# Patient Record
Sex: Female | Born: 1935 | Race: White | Hispanic: No | Smoking: Never smoker
Health system: Southern US, Community
[De-identification: ages and names within clinical notes are randomized; demographics above are authoritative.]

## PROBLEM LIST (undated history)

## (undated) DIAGNOSIS — E119 Type 2 diabetes mellitus without complications: Secondary | ICD-10-CM

## (undated) DIAGNOSIS — C4490 Unspecified malignant neoplasm of skin, unspecified: Secondary | ICD-10-CM

## (undated) DIAGNOSIS — K219 Gastro-esophageal reflux disease without esophagitis: Secondary | ICD-10-CM

## (undated) DIAGNOSIS — M810 Age-related osteoporosis without current pathological fracture: Secondary | ICD-10-CM

## (undated) DIAGNOSIS — E039 Hypothyroidism, unspecified: Secondary | ICD-10-CM

## (undated) DIAGNOSIS — C259 Malignant neoplasm of pancreas, unspecified: Secondary | ICD-10-CM

## (undated) HISTORY — PX: CHOLECYSTECTOMY: SHX55

## (undated) HISTORY — DX: Age-related osteoporosis without current pathological fracture: M81.0

## (undated) HISTORY — DX: Hypothyroidism, unspecified: E03.9

## (undated) HISTORY — DX: Type 2 diabetes mellitus without complications: E11.9

## (undated) HISTORY — DX: Gastro-esophageal reflux disease without esophagitis: K21.9

## (undated) HISTORY — DX: Malignant neoplasm of pancreas, unspecified: C25.9

## (undated) HISTORY — DX: Unspecified malignant neoplasm of skin, unspecified: C44.90

---

## 2015-01-29 ENCOUNTER — Ambulatory Visit
Admit: 2015-01-29 | Disposition: A | Discharge status: Home / Self Care | Payer: Self-pay | Attending: Oncology | Admitting: Oncology

## 2015-02-03 ENCOUNTER — Inpatient Hospital Stay: Payer: Self-pay | Admitting: Internal Medicine

## 2015-02-13 ENCOUNTER — Ambulatory Visit
Admit: 2015-02-13 | Disposition: A | Discharge status: Home / Self Care | Payer: Self-pay | Attending: Oncology | Admitting: Oncology

## 2015-02-14 ENCOUNTER — Ambulatory Visit: Payer: Self-pay | Admitting: General Surgery

## 2015-02-22 LAB — CBC CANCER CENTER
BASOS ABS: 0.1 x10 3/mm (ref 0.0–0.1)
Basophil %: 1.4 %
EOS ABS: 0.2 x10 3/mm (ref 0.0–0.7)
EOS PCT: 2.2 %
HCT: 35.6 % (ref 35.0–47.0)
HGB: 12.1 g/dL (ref 12.0–16.0)
LYMPHS ABS: 1.4 x10 3/mm (ref 1.0–3.6)
Lymphocyte %: 20 %
MCH: 32.2 pg (ref 26.0–34.0)
MCHC: 34.1 g/dL (ref 32.0–36.0)
MCV: 95 fL (ref 80–100)
MONO ABS: 0.7 x10 3/mm (ref 0.2–0.9)
MONOS PCT: 9.8 %
NEUTROS PCT: 66.6 %
Neutrophil #: 4.7 x10 3/mm (ref 1.4–6.5)
PLATELETS: 313 x10 3/mm (ref 150–440)
RBC: 3.76 10*6/uL — AB (ref 3.80–5.20)
RDW: 15.4 % — ABNORMAL HIGH (ref 11.5–14.5)
WBC: 7.1 x10 3/mm (ref 3.6–11.0)

## 2015-02-22 LAB — COMPREHENSIVE METABOLIC PANEL
ALT: 34 U/L
Albumin: 3.4 g/dL — ABNORMAL LOW
Alkaline Phosphatase: 137 U/L — ABNORMAL HIGH
Anion Gap: 5 — ABNORMAL LOW (ref 7–16)
BUN: 19 mg/dL
Bilirubin,Total: 2.7 mg/dL — ABNORMAL HIGH
CALCIUM: 8.7 mg/dL — AB
Chloride: 93 mmol/L — ABNORMAL LOW
Co2: 29 mmol/L
Creatinine: 0.83 mg/dL
EGFR (Non-African Amer.): >60
Glucose: 229 mg/dL — ABNORMAL HIGH
Potassium: 4.2 mmol/L
SGOT(AST): 33 U/L
Sodium: 127 mmol/L — ABNORMAL LOW
Total Protein: 6.9 g/dL

## 2015-02-26 LAB — CANCER ANTIGEN 19-9: CA 19 9: 10820 U/mL — AB (ref 0–35)

## 2015-02-27 LAB — CBC CANCER CENTER
Basophil #: 0 x10 3/mm (ref 0.0–0.1)
Basophil %: 0.8 %
EOS PCT: 3.9 %
Eosinophil #: 0.1 x10 3/mm (ref 0.0–0.7)
HCT: 33.8 % — ABNORMAL LOW (ref 35.0–47.0)
HGB: 11.4 g/dL — ABNORMAL LOW (ref 12.0–16.0)
LYMPHS ABS: 0.9 x10 3/mm — AB (ref 1.0–3.6)
Lymphocyte %: 27.1 %
MCH: 32.2 pg (ref 26.0–34.0)
MCHC: 33.8 g/dL (ref 32.0–36.0)
MCV: 95 fL (ref 80–100)
MONOS PCT: 7 %
Monocyte #: 0.2 x10 3/mm (ref 0.2–0.9)
NEUTROS PCT: 61.2 %
Neutrophil #: 2 x10 3/mm (ref 1.4–6.5)
PLATELETS: 193 x10 3/mm (ref 150–440)
RBC: 3.54 10*6/uL — ABNORMAL LOW (ref 3.80–5.20)
RDW: 14.8 % — AB (ref 11.5–14.5)
WBC: 3.2 x10 3/mm — ABNORMAL LOW (ref 3.6–11.0)

## 2015-02-27 LAB — COMPREHENSIVE METABOLIC PANEL
ALT: 26 U/L
AST: 28 U/L
Albumin: 3.4 g/dL — ABNORMAL LOW
Alkaline Phosphatase: 94 U/L
Anion Gap: 5 — ABNORMAL LOW (ref 7–16)
BUN: 20 mg/dL
Bilirubin,Total: 1.9 mg/dL — ABNORMAL HIGH
CO2: 28 mmol/L
Calcium, Total: 8.6 mg/dL — ABNORMAL LOW
Chloride: 97 mmol/L — ABNORMAL LOW
Creatinine: 0.71 mg/dL
EGFR (African American): >60
Glucose: 174 mg/dL — ABNORMAL HIGH
POTASSIUM: 4.1 mmol/L
SODIUM: 130 mmol/L — AB
Total Protein: 6.5 g/dL

## 2015-02-27 LAB — CREATININE, SERUM: Creatine, Serum: 0.71

## 2015-02-28 ENCOUNTER — Ambulatory Visit
Admit: 2015-02-28 | Disposition: A | Discharge status: Home / Self Care | Payer: Self-pay | Attending: Vascular Surgery | Admitting: Vascular Surgery

## 2015-03-01 ENCOUNTER — Ambulatory Visit
Admit: 2015-03-01 | Disposition: A | Discharge status: Home / Self Care | Payer: Self-pay | Attending: Oncology | Admitting: Oncology

## 2015-03-06 LAB — COMPREHENSIVE METABOLIC PANEL
ANION GAP: 3 — AB (ref 7–16)
Albumin: 3.1 g/dL — ABNORMAL LOW
Alkaline Phosphatase: 72 U/L
BILIRUBIN TOTAL: 1.2 mg/dL
BUN: 15 mg/dL
CO2: 27 mmol/L
CREATININE: 0.54 mg/dL
Calcium, Total: 8.2 mg/dL — ABNORMAL LOW
Chloride: 97 mmol/L — ABNORMAL LOW
EGFR (African American): >60
Glucose: 180 mg/dL — ABNORMAL HIGH
POTASSIUM: 4 mmol/L
SGOT(AST): 22 U/L
SGPT (ALT): 19 U/L
SODIUM: 127 mmol/L — AB
Total Protein: 6.1 g/dL — ABNORMAL LOW

## 2015-03-06 LAB — CBC CANCER CENTER
Basophil #: 0 x10 3/mm (ref 0.0–0.1)
Basophil %: 0.3 %
EOS PCT: 1.8 %
Eosinophil #: 0 x10 3/mm (ref 0.0–0.7)
HCT: 31.6 % — AB (ref 35.0–47.0)
HGB: 10.8 g/dL — AB (ref 12.0–16.0)
Lymphocyte #: 1 x10 3/mm (ref 1.0–3.6)
Lymphocyte %: 38.6 %
MCH: 32.5 pg (ref 26.0–34.0)
MCHC: 34.1 g/dL (ref 32.0–36.0)
MCV: 95 fL (ref 80–100)
MONO ABS: 0.2 x10 3/mm (ref 0.2–0.9)
Monocyte %: 7.6 %
NEUTROS ABS: 1.3 x10 3/mm — AB (ref 1.4–6.5)
NEUTROS PCT: 51.7 %
Platelet: 163 x10 3/mm (ref 150–440)
RBC: 3.32 10*6/uL — AB (ref 3.80–5.20)
RDW: 14.5 % (ref 11.5–14.5)
WBC: 2.5 x10 3/mm — ABNORMAL LOW (ref 3.6–11.0)

## 2015-03-20 LAB — CBC CANCER CENTER
BASOS ABS: 0.1 x10 3/mm (ref 0.0–0.1)
Basophil %: 1.1 %
EOS ABS: 0.2 x10 3/mm (ref 0.0–0.7)
Eosinophil %: 2.2 %
HCT: 32.8 % — AB (ref 35.0–47.0)
HGB: 10.9 g/dL — ABNORMAL LOW (ref 12.0–16.0)
LYMPHS ABS: 1.4 x10 3/mm (ref 1.0–3.6)
Lymphocyte %: 17.6 %
MCH: 32.2 pg (ref 26.0–34.0)
MCHC: 33.3 g/dL (ref 32.0–36.0)
MCV: 97 fL (ref 80–100)
MONO ABS: 1.2 x10 3/mm — AB (ref 0.2–0.9)
Monocyte %: 15.6 %
NEUTROS ABS: 4.9 x10 3/mm (ref 1.4–6.5)
Neutrophil %: 63.5 %
PLATELETS: 359 x10 3/mm (ref 150–440)
RBC: 3.4 10*6/uL — ABNORMAL LOW (ref 3.80–5.20)
RDW: 15.4 % — AB (ref 11.5–14.5)
WBC: 7.8 x10 3/mm (ref 3.6–11.0)

## 2015-03-20 LAB — COMPREHENSIVE METABOLIC PANEL
ALBUMIN: 2.8 g/dL — AB
AST: 20 U/L
Alkaline Phosphatase: 61 U/L
Anion Gap: 7 (ref 7–16)
BUN: 16 mg/dL
Bilirubin,Total: 0.9 mg/dL
CALCIUM: 8.6 mg/dL — AB
Chloride: 99 mmol/L — ABNORMAL LOW
Co2: 30 mmol/L
Creatinine: 0.52 mg/dL
EGFR (African American): >60
EGFR (Non-African Amer.): >60
GLUCOSE: 186 mg/dL — AB
Potassium: 3.5 mmol/L
SGPT (ALT): 15 U/L
Sodium: 136 mmol/L
TOTAL PROTEIN: 5.8 g/dL — AB

## 2015-03-21 LAB — CANCER ANTIGEN 19-9: CA 19-9: 3673 U/mL — ABNORMAL HIGH (ref 0–35)

## 2015-03-27 LAB — CBC CANCER CENTER
BASOS ABS: 0.1 x10 3/mm (ref 0.0–0.1)
Basophil %: 2.9 %
EOS ABS: 0 x10 3/mm (ref 0.0–0.7)
Eosinophil %: 1 %
HCT: 30 % — ABNORMAL LOW (ref 35.0–47.0)
HGB: 10.1 g/dL — AB (ref 12.0–16.0)
LYMPHS ABS: 1.1 x10 3/mm (ref 1.0–3.6)
LYMPHS PCT: 24.3 %
MCH: 32.5 pg (ref 26.0–34.0)
MCHC: 33.5 g/dL (ref 32.0–36.0)
MCV: 97 fL (ref 80–100)
MONOS PCT: 7.8 %
Monocyte #: 0.3 x10 3/mm (ref 0.2–0.9)
Neutrophil #: 2.8 x10 3/mm (ref 1.4–6.5)
Neutrophil %: 64 %
Platelet: 300 x10 3/mm (ref 150–440)
RBC: 3.09 10*6/uL — AB (ref 3.80–5.20)
RDW: 14.6 % — ABNORMAL HIGH (ref 11.5–14.5)
WBC: 4.4 x10 3/mm (ref 3.6–11.0)

## 2015-03-27 LAB — COMPREHENSIVE METABOLIC PANEL
ALT: 17 U/L
Albumin: 3 g/dL — ABNORMAL LOW
Alkaline Phosphatase: 60 U/L
Anion Gap: 3 — ABNORMAL LOW (ref 7–16)
BILIRUBIN TOTAL: 0.7 mg/dL
BUN: 19 mg/dL
CREATININE: 0.67 mg/dL
Calcium, Total: 7.8 mg/dL — ABNORMAL LOW
Chloride: 97 mmol/L — ABNORMAL LOW
Co2: 28 mmol/L
EGFR (African American): >60
EGFR (Non-African Amer.): >60
Glucose: 222 mg/dL — ABNORMAL HIGH
POTASSIUM: 3.7 mmol/L
SGOT(AST): 25 U/L
Sodium: 128 mmol/L — ABNORMAL LOW
TOTAL PROTEIN: 5.9 g/dL — AB

## 2015-03-29 ENCOUNTER — Other Ambulatory Visit: Payer: Self-pay | Admitting: Oncology

## 2015-03-29 DIAGNOSIS — C787 Secondary malignant neoplasm of liver and intrahepatic bile duct: Principal | ICD-10-CM

## 2015-03-29 DIAGNOSIS — C259 Malignant neoplasm of pancreas, unspecified: Secondary | ICD-10-CM | POA: Insufficient documentation

## 2015-03-31 NOTE — Discharge Summary (Signed)
PATIENT NAME:  Amy Mckenzie, Amy Mckenzie MR#:  [id number] DATE OF BIRTH:  10-Jul-1921  DATE OF ADMISSION:  02/03/2015 DATE OF DISCHARGE:  02/06/2015  ADMITTING DIAGNOSIS: Jaundice.   DISCHARGE DIAGNOSES:  1.  Obstructive jaundice, painless.  2.  Pancreatic head mass.   3.  Right hepatic lobe mass of 14 mm status post endoscopic retrograde cholangiopancreatography on 02/05/2015 by Dr. Allen Norris and stent to common bile duct placement.  4.  Diabetes mellitus.   5.  Hypokalemia.  6.  Hypothyroidism, not otherwise specified.  7.  Back pain.  8.  PVC's,  Cardiac arrhythmia, not otherwise specified.  10.  Gastroesophageal reflux disease without esophagitis.  11.  Osteoporosis.   12.  Lower extremity edema.   DISCHARGE CONDITION: Stable.   DISCHARGE MEDICATIONS: The patient is to continue propranolol 40 mg p.o. twice daily, omeprazole 10 mg p.o. daily, meloxicam 60 mg p.o. daily, levothyroxine 25 mcg alternating with 50 mcg every other day, docusate 100 mg twice daily as needed.   HOME OXYGEN: None.   DIET: Two grams salt, low-fat, low-cholesterol, carbohydrate -controlled diet, Glucerna dietary supplements 3 times a day. Diet consistency: Regular.   ACTIVITY LIMITATIONS: As tolerated.   FOLLOWUP APPOINTMENT: Dr. Grayland Ormond in 1 week after discharge.   CONSULTANTS: Care management, social work, Dr. Grayland Ormond, Dr. Allen Norris, Dr. Vira Agar.   RADIOLOGIC STUDIES: PET CT scan, 02/06/2015, showed no focal hypermetabolic activity to suggest skeletal metastasis. Impression: Hypermetabolic FDG accumulation in the pancreatic head mass consistent with malignancy, 2 focal areas of FBG uptake in the liver suggest metastatic involvement.   HOSPITAL COURSE: The patient is a 81 year old Caucasian female with past medical history significant for history of multiple medical problems including diabetes, hypothyroidism, who presented to the hospital with complaints of jaundice. Please refer to Dr. Keenan Bachelor admission note on  02/03/2015. On arrival to the hospital, the patient's vitals, temperature was 97.9, pulse was 66, respiration rate was 20, blood pressure 146/71, saturation 100% on room air. Physical exam revealed a jaundiced lady. No significant abnormalities were otherwise noted. The patient did have 1+ lower extremity edema just above the ankles. The patient's lab data done on arrival to the Emergency Room showed potassium level low at 3.3, otherwise BMP was normal. The patient's liver enzymes revealed a total bilirubin of 11.7, alkaline phosphatase 426, AST as well as ALT were 217 and 312 respectively. CBC: White blood cell count was 5.6, hemoglobin was 12.0, platelet count was 221, 000, absolute neutrophil count was normal at 3.7. Coagulation panel was within normal limits. The patient was admitted to the hospital for further evaluation. Consultation with gastroenterologist was obtained. Dr. Vira Agar saw the patient in consultation the same day of admission and followed her along. He felt that the patient did have jaundice and recommended CT scan with contrast. The patient underwent a CT scan with contrast on 02/03/2015, which revealed the pancreatic head mass highly suspicious for malignancy associated with severe biliary dilatation, as well as pancreatic ductal dilatation. Hepatic as well as left renal cysts were noted. A right hepatic mass measuring 14 mm, possibly metastatic lesion was also noted and acute appearing compression deformity of L1 vertebral body with retropulsion causing canal narrowing. The patient was seen by Dr. Lucilla Lame and underwent ERCP on 02/05/2015. During ERCP she had severe biliary stricture noted as well as upper third of main bile duct and middle third of main bile duct were dilated. Sphincterotomy was performed and one metal stent was placed into common bile duct. Post procedure, the  patient's liver enzymes and total bilirubin improved. On 02/06/2015, the patient's total bilirubin was 9.0,  alkaline phosphatase 361, AST as well as ALT were 97 and 152 respectively. Lipase level was found to be slightly elevated at 195 on 02/06/2015; however, the patient had no symptoms, no pain on palpation. It was felt that the patient is stable to be discharged home today on 02/06/2015. She is to follow up with Dr. Grayland Ormond for further recommendations. Dr. Grayland Ormond who saw the patient in consultation on 02/05/2015, recommended PET CT scan for pancreatic cancer staging. He recommended to wait for ERCP cytology brushings and to get it CA-19-9 as well as PET scan for further evaluation, follow up with [facility] on March 14 or 15 to discuss biopsy results as well as treatment planning. The patient underwent PET CT scanning today, on 02/06/2015, and was ready to be discharged home. On the day of discharge, temperature is 97.5, pulse was 59, respiration rate was 20, blood pressure 148/80, saturation was 97%-98% on room air at rest.   TIME SPENT: 40 minutes.   ____________________________ Theodoro Grist, MD rv:bm D: 02/06/2015 17:14:35 ET T: 02/07/2015 00:48:50 ET JOB#: [id number]  cc: Theodoro Grist, MD, <Dictator> Kathlene November. Grayland Ormond, MD  Theodoro Grist MD ELECTRONICALLY SIGNED 02/13/2015 14:18

## 2015-03-31 NOTE — Consult Note (Signed)
PATIENT NAME:  Amy Mckenzie, Amy Mckenzie MR#:  [id number] DATE OF BIRTH:  02/16/1921  DATE OF CONSULTATION:  02/04/2015  REFERRING PHYSICIAN:   CONSULTING PHYSICIAN:  Manya Silvas, MD  HISTORY OF PRESENT ILLNESS: The patient is a 81 year old white female who was noted by her daughter, who is an Therapist, sports, over 2-week period of time she has developed deep jaundice. The patient lives in [place], about 3 hours away, and her daughter drove her back to [place] where she could get medical attention for her jaundice and any underlying cause.   PAST MEDICAL HISTORY: She has back pain. She had a right cheek skin cancer biopsy with a cosmetic surgery, history of hypothyroidism, history of diabetes type 2 without complications, history of GERD, osteoporosis.   PAST SURGICAL HISTORY: Gallbladder surgery, squamous cell carcinoma of the cheek, coccyx removal in 1949, and breast biopsies   ALLERGIES: SHE IS ALLERGIC TO MEDICATION THAT DILATES HER EYES.   FAMILY HISTORY: Father had cardiovascular disease and died at age 86. Mother died in an automobile accident at age 22.   SOCIAL HISTORY: The patient's husband passed away on 2022/12/30 at the age of 65.  REVIEW OF SYSTEMS: Complains of gradual weight loss over the last few years, noted that her urine is getting lighter. Walks with a cane. Denies any asthma or wheezing or chronic cough. Denies any skipping irregular heartbeats that she has been told brief spells of skipping heartbeats may be brief atrial fibrillation, possibly SVT as well. No rectal bleeding. No constipation or diarrhea. Usually has 1 bowel movement a day.   PHYSICAL EXAMINATION: GENERAL: An elderly white female who looks younger than stated age, in no distress.  VITAL SIGNS: Blood pressure 146/70, respirations 20, pulse 66, temperature 97.9. HEENT: Sclerae are icteric. No conjunctivitis. The head is atraumatic.  NECK: Negative.  CHEST: Clear.  HEART: Shows no murmurs or gallops I could hear.   ABDOMEN: Minimal discomfort right upper quadrant on palpation, questionable hepatomegaly.  EXTREMITIES: Some swelling in the lower extremities, but no pedal edema.   DIAGNOSTIC DATA: CAT scan of the abdomen shows a mass in the head of the pancreas and about a 15 mm rounded mass in the liver. The glucose is 98, BUN 20, creatinine 0.58, sodium 136, potassium 3.3, chloride 99, CO2 of 30, calcium 8.3, total protein 6.6, albumin 2.8, total bilirubin 11.7, direct bilirubin 9.2, alkaline phosphatase 426, SGOT 212, SGPT 312. White count 5.6, hemoglobin 12, hematocrit 34.7, platelet count 221,000.   ASSESSMENT: Recent development of jaundice with pancreatic mass, likely pancreatic cancer with common bile duct obstruction.   PLAN: To do an ERCP and probable stent placement tomorrow with Dr. Allen Norris.   ____________________________ Manya Silvas, MD rte:sw D: 02/04/2015 18:16:37 ET T: 02/04/2015 18:52:03 ET JOB#: [id number]  cc: Manya Silvas, MD, <Dictator> Manya Silvas MD ELECTRONICALLY SIGNED 02/07/2015 8:56

## 2015-03-31 NOTE — Consult Note (Signed)
Note Type Consult   Subjective: Chief Complaint/Diagnosis:   painless jaundice, pancreatic head mass suspicious for malignancy. HPI:   Patient is a 81 year old female who was in her usual state of health with her daughter noticed her becoming increasingly jaundiced. She presented to the emergency room is found to have a bilirubin greater than 11 as well as a pancreatic head mass highly suspicious for malignancy. Currently, patient feels well. She has no neurologic complaints. She denies any recent fevers or illnesses. She denies any weight loss. She has no chest pain or shortness of breath. She denies any abdominal pain. She denies any nausea, vomiting, constipation, or diarrhea. She has no urinary complaints. Patient otherwise feels well and offers no further specific complaints.   Review of Systems:  Performance Status (ECOG): 0  Review of Systems:   As per HPI. Otherwise, 10 point system review was negative.   Allergies:  NKDA: None  PFSH: Additional Past Medical and Surgical History: hypothyroidism, skin cancer, diabetes, GERD, osteoporosis. Cholecystectomy.    Family history: Diabetes, AAA.    Social history: Patient denies tobacco, occasional alcohol.   Home Medications: Medication Instructions Last Modified Date/Time  levothyroxine 25 mcg (0.025 mg) oral tablet 1 tab(s) orally every  other alternating with 50 mcg every other day. 07-Mar-16 11:07  raloxifene 60 mg oral tablet 1 tab(s) orally once a day 07-Mar-16 11:07  omeprazole 20 mg oral delayed release tablet 1 tab(s) orally once a day 07-Mar-16 11:07  propranolol 40 mg oral tablet 1 tab(s) orally 2 times a day 07-Mar-16 11:07   Vital Signs:  :: vital signs stable, patient afebrile.   Physical Exam:  General: well developed, well nourished, and in no acute distress  Mental Status: normal affect  Eyes: icteric sclera  Head, Ears, Nose,Throat: Normocephalic, moist mucous membranes, clear oropharynx without erythema or  thrush.  Neck, Thyroid: No palpable lymphadenopathy, thyroid midline without nodules.  Respiratory: clear to auscultation bilaterally  Cardiovascular: regular rate and rhythm, no murmur, rub, or gallop  Gastrointestinal: soft, nondistended, nontender, no organomegaly.  normal active bowel sounds  Musculoskeletal: No edema  Skin: jaundice.  Neurological: alert, answering all questions appropriately.  Cranial nerves grossly intact   Laboratory Results: Hepatic:  06-Mar-16 19:18   Bilirubin, Total  11.5  Bilirubin, Total  11.7  Bilirubin, Direct  9.2 (Result(s) reported on 03 Feb 2015 at 08:05PM.)  Alkaline Phosphatase  422  Alkaline Phosphatase  426  SGPT (ALT)  309  SGPT (ALT)  312  SGOT (AST)  216  SGOT (AST)  217  Total Protein, Serum 6.5  Total Protein, Serum 6.6  Albumin, Serum  2.8  Albumin, Serum  2.8  Routine Chem:  06-Mar-16 19:18   Glucose, Serum 98  BUN  20  Creatinine (comp)  0.58  Sodium, Serum 136  Potassium, Serum  3.3  Chloride, Serum 99  CO2, Serum 30  Calcium (Total), Serum  8.3  Osmolality (calc) 275  eGFR (African American) >60  eGFR (Non-African American) >60 (eGFR values <68m/min/1.73 m2 may be an indication of chronic kidney disease (CKD). Calculated eGFR, using the MRDR Study equation, is useful in  patients with stable renal function. The eGFR calculation will not be reliable in acutely ill patients when serum creatinine is changing rapidly. It is not useful in patients on dialysis. The eGFR calculation may not be applicable to patients at the low and high extremes of body sizes, pregnant women, and vegetarians.)  Anion Gap 7  Routine Hem:  06-Mar-16  19:18   WBC (CBC) 5.6  RBC (CBC)  3.68  Hemoglobin (CBC) 12.0  Hematocrit (CBC)  34.7  Platelet Count (CBC) 221  MCV 94  MCH 32.6  MCHC 34.7  RDW  15.8  Neutrophil % 66.3  Lymphocyte % 17.6  Monocyte % 13.8  Eosinophil % 1.0  Basophil % 1.3  Neutrophil # 3.7  Lymphocyte # 1.0   Monocyte # 0.8  Eosinophil # 0.1  Basophil # 0.1 (Result(s) reported on 03 Feb 2015 at 07:39PM.)   Medical Imaging Results:   Review Medical Imaging   Abdomen and Pelvis With Contrast 03-Feb-2015 20:48:00: IMPRESSION:  1. Pancreatic head mass highly suspicious for malignancy associated  with severe biliary dilatation as well as pancreatic ductal  dilatation.  2. Hepatic and left renal cysts.  3. Right hepatic mass measuring 14 mm possibly metastatic lesion.  4. Acute appearing compression deformity L1 vertebral body with  retropulsion causing canal narrowing.      Electronically Signed    By: Skipper Cliche M.D.    On: 02/03/2015 21:02         Verified By: Rachael Fee, M.D.,  Assessment and Plan: Impression:   pancreatic head mass, suspicious for malignancy. Plan:   1. Pancreatic head mass: CT scan results as above. ERCP with cytology brushings currently pending. Will order a CA-19-9 as well as PET scan for further evaluation. Okay to discharge patient from an oncology standpoint and will arrange for follow-up in the [facility] on March 14 or 15th to discuss her biopsy results and treatment planning. consult, call with questions.  Electronic Signatures: Delight Hoh (MD)  (Signed 08-Mar-16 18:28)  Authored: Note Type, CC/HPI, Review of Systems, ALLERGIES, Patient Family Social History, HOME MEDICATIONS, Vital Signs, Physical Exam, Lab Results Review, Rad Results Review, Assessment and Plan   Last Updated: 08-Mar-16 18:28 by Delight Hoh (MD)

## 2015-03-31 NOTE — H&P (Signed)
PATIENT NAME:  Amy Mckenzie, Amy Mckenzie MR#:  [id number] DATE OF BIRTH:  1921/05/12  DATE OF ADMISSION:  02/03/2015  PRIMARY CARE PHYSICIAN: Nonlocal.   HISTORY OF PRESENT ILLNESS: The patient is a 81 year old, Caucasian female with past medical history significant for history of multiple medical problems including hypothyroidism, recent diagnosis of diabetes mellitus, who presented to the hospital with complaints of jaundice. Apparently, the patient was seen by her daughter approximately 2 weeks ago and she looked fine, however, yesterday she was noted to have a yellow face and was complaining of some dark urine as well as lighter colored stool. The patient was apparently watched by her family and noted to have mild jaundice possibly a week ago. She has no significant abdominal discomfort, however, on palpation she is having some right upper quadrant discomfort. The patient was taken from her home in [place] and brought here to the emergency room by her daughter who is Dr. Percell Boston in endoscopy for admission. The patient herself complaints of feeling some fatigue and weakness for a long period of time chronically. She has been having some weight loss over the past 4 years gradually, since her husband has been ill. The patient also has been having intermittent palpitations and lower extremity edema which has been present there for years. She has no other symptoms.   PAST MEDICAL HISTORY: Significant for history of back pain, history of right cheek defect due to skin cancer biopsy. The patient did have right cheek rotation flap as well as scalp rotation flap done by Dr. Carlis Abbott in 2005, history of PVCs intermittently, hypothyroidism, diabetes mellitus type 2 without complications, gastroesophageal reflux disease without esophagitis, osteoporosis, lower extremity swelling.   MEDICATIONS: The patient is on aspirin 81 mg p.o. daily, levothyroxine 25 mcg p.o. alternating with 50 mcg p.o. every second day, Lasix 20 mg p.o.  daily as needed, propranolol 40 mg p.o. twice daily, Glucotrol XL 2.5 mg p.o. daily, omeprazole 20 mg p.o. daily.   PAST SURGICAL HISTORY: Gallbladder surgery in 1992. The patient had squamous cell carcinoma taken off of her cheek. The patient had coccyx removed in 1949. The patient did have breast biopsies done, which was read found to be benign.   ALLERGIES: QUESTIONABLE ALLERGY TO ATROPINE.   FAMILY HISTORY: Diabetes mellitus in the patient's father who had AAA surgery and died at the age of 98. The patient's mother died of motor vehicle accident at the age of 70. Sister died at an early age.   SOCIAL HISTORY: The patient is widowed after 25 years of marriage since 12/14/2014. No smoking, occasional alcohol use. 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   REVIEW OF SYSTEMS:  CONSTITUTIONAL: Positive for fatigue and weakness for a long period of time, weight loss over the past 4 years, nothing recently. Some blurring of vision for which she uses bifocals. Admits to having some lower extremity edema which seems to be chronic, admits to palpitations intermittently and some intermittent distention of her abdomen. The patient did have a large bowel movement today, early in the morning, which light colored. She has been having light-colored stool for the past 3 days at least. She cannot walk long distances because she gets tired easily. She uses a cane. Otherwise denies any fevers, pains or weight gain.  EYES: Denies double vision, glaucoma, or cataracts.  ENT: Denies any tinnitus, allergies, epistaxis, sinus pain, dentures, difficulty swallowing.  RESPIRATORY: Denies cough, wheezing, shortness of breath.  CARDIOVASCULAR: Denies any orthopnea, edema, arrhythmias,  palpitations or syncope.  GASTROINTESTINAL: Denies any nausea, vomiting, diarrhea, constipation, rectal bleeding, change in bowel habits.  ENITOURINARY: Denies dysuria, hematuria, frequency, incontinence.  ENDOCRINE: Denies  polydipsia, nocturia, thyroid problems, heat or cold intolerance or thirst.  HEMATOLOGIC: Denies any anemia, easy bruisability or blood loss.  SKIN: Denies acne, lesions or change in moles.  MUSCULOSKELETAL: Denies arthritis, cramps.  NEUROLOGIC: Denies numbness, epilepsy or tremors.  PSYCHIATRIC: Denies anxiety, insomnia or depression.   PHYSICAL EXAMINATION: VITAL SIGNS: On arrival to the hospital the patient's temperature 97.9, pulse was 66, respiration was 20, blood pressure 146/71, saturation was 100% on room air.  GENERAL: This is a well-developed, well-nourished, thin, Caucasian female, sitting on the chair.  HEENT: Her pupils are equal, reactive to light. Extraocular movements intact. The patient has some icterus. No conjunctivitis. Has normal hearing. No pharyngeal erythema. Mucosa is moist.  NECK: No masses. Supple and nontender. No adenopathy. No JVD or carotid bruits bilaterally. Full range of motion.  LUNGS: Clear to auscultation in all fields. No rales, rhonchi, or wheezing. No labored inspirations, increased effort, dullness to  percussion, or respiratory distress.  CARDIOVASCULAR: S1, S2 appreciated. Rhythm was regular. PMI not lateralized. Chest is nontender to palpation. 1+ pedal pulses.  EXTREMITIES: 1+ to 3+ lower extremity edema. No calf tenderness or cyanosis was noted.  ABDOMEN: Soft. Minimal discomfort in the right upper quadrant on palpation. The patient has no masses. Mild hepatomegaly was noted but no splenomegaly with a smooth and sharp edge of the liver approximately 1 to 2 cm below the costophrenic edge.  MUSCLE STRENGTH: Able to move all extremities. No cyanosis or degenerative joint disease. Mild kyphosis. Gait was not tested.  SKIN: Did not reveal any rashes, lesions, erythema, nodularity or induration. It was warm and dry to palpation.  LYMPHATIC: No adenopathy in the cervical region.  NEUROLOGICAL: Cranial nerves grossly intact. Sensory is intact. No dysarthria  or aphasia. The patient is alert, oriented to time, person and place, cooperative. Memory is somewhat impaired. No numbness. no confusion, agitation or depression was noted.   LABORATORY DATA: Pending.   RADIOLOGIC STUDIES: CT scan of abdomen and pelvis is still pending.   ASSESSMENT AND PLAN: 1. Jaundice of unclear etiology at this time, painless. We will get CT scan of abdomen and pelvis and get gastroenterology consultation. We will get labs.  2. Diabetes mellitus, new diagnosis. Continue Glucotrol as well as sliding scale insulin.  3. Hyperthyroidism. Continue Synthroid.  4. Lower extremity swelling, chronic. We will continue Lasix as needed.  5. Arrhythmias, nonspecified. Continue propranolol.   TIME SPENT: 50 minutes on the patient.    ____________________________ Theodoro Grist, MD rv:TT D: 02/03/2015 18:17:20 ET T: 02/03/2015 18:40:10 ET JOB#: [id number]  cc: Theodoro Grist, MD, <Dictator> Tallan Sandoz MD ELECTRONICALLY SIGNED 03/03/2015 18:28

## 2015-03-31 NOTE — Op Note (Signed)
PATIENT NAME:  Amy Mckenzie, Amy Mckenzie MR#:  [id number] DATE OF BIRTH:  May 23, 1921  DATE OF PROCEDURE:  02/28/2015  PREOPERATIVE DIAGNOSIS:  Pancreatic cancer with poor venous access and need for a Port-A-Cath for chemotherapy and durable venous access.   POSTOPERATIVE DIAGNOSIS:  Pancreatic cancer with poor venous access and need for a Port-A-Cath for chemotherapy and durable venous access.   PROCEDURES:  1.  Ultrasound guidance for vascular access, right internal jugular vein.  2.  Fluoroscopic guidance for placement of catheter.  3.  Placement of CT compatible Port-A-Cath, right internal jugular vein.   SURGEON:  Leotis Pain, MD.    ANESTHESIA:  Local with moderate conscious sedation.   FLUOROSCOPY TIME:  Less than 1 minute.   CONTRAST:  Zero.   ESTIMATED BLOOD LOSS:  Minimal.    INDICATION FOR PROCEDURE: This is a 81 year old female who was recently started on chemotherapy for pancreatic cancer. She has very poor peripheral venous access. Her initial treatments have been difficult due to poor venous access. We are asked to place a Port-A-Cath for durable venous access for chemotherapy, CT scans, and laboratory draws.   Risks and benefits were discussed. Informed consent was obtained.   DESCRIPTION OF THE PROCEDURE:  The patient was brought to the vascular and interventional radiology suite. The right neck and chest were sterilely prepped and draped, and a sterile surgical field was created. Ultrasound was used to help visualize a patent right internal jugular vein. This was then accessed under direct ultrasound guidance without difficulty with a Seldinger needle and a permanent image was recorded. A J-wire was placed. After skin nick and dilatation, the peel-away sheath was then placed over the wire. I then anesthetized an area under the clavicle approximately 2 fingerbreadths. A transverse incision was created and an inferior pocket was created with electrocautery and blunt dissection. The port  was then brought onto the field, placed into the pocket and secured to the chest wall with 2 Prolene sutures. The catheter was connected to the port and tunneled from the subclavicular incision to the access site. Fluoroscopic guidance was used to cut the catheter to an appropriate length. The catheter was then placed through the peel-away sheath and the peel-away sheath was removed. The catheter tip was parked in excellent location in the mid superior vena cava. The pocket was then irrigated with antibiotic-impregnated saline and the wound was closed with a running 3-0 Vicryl and a 4-0 Monocryl. The access incision was closed with a single 4-0 Monocryl. The Huber needle was used to withdraw blood and flush the port with heparinized saline. Dermabond was then placed as a dressing. The patient tolerated the procedure well and was taken to the recovery room in stable condition.    ____________________________ Algernon Huxley, MD jsd:bu D: 02/28/2015 09:58:02 ET T: 02/28/2015 12:36:02 ET JOB#: [id number]  cc: Algernon Huxley, MD, <Dictator> Algernon Huxley MD ELECTRONICALLY SIGNED 03/04/2015 15:51

## 2015-03-31 NOTE — Consult Note (Signed)
Pt for ERCP tomorrow with Dr. Allen Norris, likely need general anesthesia given her age.  Pt denies abd pain, vomiting, signif nausea.  Chest clear, heart RRR, skin deeply yellow as is conjunctiva.  Dr. Grayland Ormond consulted and will see in morning.  Electronic Signatures: Manya Silvas (MD)  (Signed on 07-Mar-16 18:09)  Authored  Last Updated: 07-Mar-16 18:09 by Manya Silvas (MD)

## 2015-04-03 ENCOUNTER — Encounter: Payer: Self-pay | Admitting: Oncology

## 2015-04-03 ENCOUNTER — Encounter (INDEPENDENT_AMBULATORY_CARE_PROVIDER_SITE_OTHER): Payer: Self-pay

## 2015-04-03 ENCOUNTER — Inpatient Hospital Stay: Payer: Medicare Other | Attending: Oncology

## 2015-04-03 ENCOUNTER — Inpatient Hospital Stay (HOSPITAL_BASED_OUTPATIENT_CLINIC_OR_DEPARTMENT_OTHER): Payer: Medicare Other | Admitting: Oncology

## 2015-04-03 ENCOUNTER — Inpatient Hospital Stay: Payer: Medicare Other

## 2015-04-03 ENCOUNTER — Ambulatory Visit: Payer: Self-pay | Admitting: Internal Medicine

## 2015-04-03 VITALS — BP 133/82 | HR 61 | Temp 95.6°F | Resp 16 | Wt 114.4 lb

## 2015-04-03 DIAGNOSIS — D701 Agranulocytosis secondary to cancer chemotherapy: Secondary | ICD-10-CM | POA: Diagnosis not present

## 2015-04-03 DIAGNOSIS — Z79899 Other long term (current) drug therapy: Secondary | ICD-10-CM | POA: Insufficient documentation

## 2015-04-03 DIAGNOSIS — C787 Secondary malignant neoplasm of liver and intrahepatic bile duct: Principal | ICD-10-CM

## 2015-04-03 DIAGNOSIS — Z5111 Encounter for antineoplastic chemotherapy: Secondary | ICD-10-CM | POA: Insufficient documentation

## 2015-04-03 DIAGNOSIS — D6481 Anemia due to antineoplastic chemotherapy: Secondary | ICD-10-CM | POA: Insufficient documentation

## 2015-04-03 DIAGNOSIS — E1165 Type 2 diabetes mellitus with hyperglycemia: Secondary | ICD-10-CM | POA: Diagnosis not present

## 2015-04-03 DIAGNOSIS — C250 Malignant neoplasm of head of pancreas: Secondary | ICD-10-CM | POA: Insufficient documentation

## 2015-04-03 DIAGNOSIS — C259 Malignant neoplasm of pancreas, unspecified: Secondary | ICD-10-CM

## 2015-04-03 DIAGNOSIS — Z85828 Personal history of other malignant neoplasm of skin: Secondary | ICD-10-CM

## 2015-04-03 DIAGNOSIS — M810 Age-related osteoporosis without current pathological fracture: Secondary | ICD-10-CM | POA: Insufficient documentation

## 2015-04-03 DIAGNOSIS — K219 Gastro-esophageal reflux disease without esophagitis: Secondary | ICD-10-CM

## 2015-04-03 DIAGNOSIS — E119 Type 2 diabetes mellitus without complications: Secondary | ICD-10-CM | POA: Insufficient documentation

## 2015-04-03 DIAGNOSIS — E039 Hypothyroidism, unspecified: Secondary | ICD-10-CM | POA: Insufficient documentation

## 2015-04-03 DIAGNOSIS — Z7982 Long term (current) use of aspirin: Secondary | ICD-10-CM | POA: Insufficient documentation

## 2015-04-03 DIAGNOSIS — T451X5S Adverse effect of antineoplastic and immunosuppressive drugs, sequela: Secondary | ICD-10-CM | POA: Diagnosis not present

## 2015-04-03 DIAGNOSIS — D72819 Decreased white blood cell count, unspecified: Secondary | ICD-10-CM | POA: Diagnosis not present

## 2015-04-03 LAB — COMPREHENSIVE METABOLIC PANEL
ALT: 14 U/L (ref 14–54)
AST: 19 U/L (ref 15–41)
Albumin: 3.1 g/dL — ABNORMAL LOW (ref 3.5–5.0)
Alkaline Phosphatase: 54 U/L (ref 38–126)
Anion gap: 5 (ref 5–15)
BUN: 17 mg/dL (ref 6–20)
CALCIUM: 8.2 mg/dL — AB (ref 8.9–10.3)
CO2: 27 mmol/L (ref 22–32)
CREATININE: 0.58 mg/dL (ref 0.44–1.00)
Chloride: 101 mmol/L (ref 101–111)
GFR calc non Af Amer: >60 mL/min (ref >60)
GLUCOSE: 199 mg/dL — AB (ref 65–99)
Potassium: 3.7 mmol/L (ref 3.5–5.1)
Sodium: 133 mmol/L — ABNORMAL LOW (ref 135–145)
TOTAL PROTEIN: 6.1 g/dL — AB (ref 6.5–8.1)
Total Bilirubin: 0.5 mg/dL (ref 0.3–1.2)

## 2015-04-03 LAB — CBC WITH DIFFERENTIAL/PLATELET
Basophils Absolute: 0 K/uL (ref 0–0.1)
Basophils Relative: 1 %
Eosinophils Absolute: 0 K/uL (ref 0–0.7)
Eosinophils Relative: 1 %
HCT: 30.5 % — ABNORMAL LOW (ref 35.0–47.0)
Hemoglobin: 10.2 g/dL — ABNORMAL LOW (ref 12.0–16.0)
Lymphocytes Relative: 34 %
Lymphs Abs: 0.7 K/uL — ABNORMAL LOW (ref 1.0–3.6)
MCH: 32.8 pg (ref 26.0–34.0)
MCHC: 33.5 g/dL (ref 32.0–36.0)
MCV: 97.9 fL (ref 80.0–100.0)
Monocytes Absolute: 0.2 K/uL (ref 0.2–0.9)
Monocytes Relative: 11 %
Neutro Abs: 1.1 K/uL — ABNORMAL LOW (ref 1.4–6.5)
Neutrophils Relative %: 53 %
Platelets: 175 K/uL (ref 150–440)
RBC: 3.11 MIL/uL — ABNORMAL LOW (ref 3.80–5.20)
RDW: 14.7 % — ABNORMAL HIGH (ref 11.5–14.5)
WBC: 2.1 K/uL — ABNORMAL LOW (ref 3.6–11.0)

## 2015-04-03 MED ORDER — SODIUM CHLORIDE 0.9 % IV SOLN
800.0000 mg/m2 | Freq: Once | INTRAVENOUS | Status: AC
  Administered 2015-04-03: 1178 mg via INTRAVENOUS
  Filled 2015-04-03: qty 26.3

## 2015-04-03 MED ORDER — SODIUM CHLORIDE 0.9 % IV SOLN
Freq: Once | INTRAVENOUS | Status: AC
  Administered 2015-04-03: 13:00:00 via INTRAVENOUS
  Filled 2015-04-03: qty 250

## 2015-04-03 MED ORDER — SODIUM CHLORIDE 0.9 % IJ SOLN
10.0000 mL | INTRAMUSCULAR | Status: DC | PRN
  Filled 2015-04-03: qty 10

## 2015-04-03 MED ORDER — PACLITAXEL PROTEIN-BOUND CHEMO INJECTION 100 MG
100.0000 mg/m2 | Freq: Once | INTRAVENOUS | Status: AC
  Administered 2015-04-03: 150 mg via INTRAVENOUS
  Filled 2015-04-03: qty 30

## 2015-04-03 MED ORDER — HEPARIN SOD (PORK) LOCK FLUSH 100 UNIT/ML IV SOLN
500.0000 [IU] | Freq: Once | INTRAVENOUS | Status: AC | PRN
  Administered 2015-04-03: 500 [IU]
  Filled 2015-04-03: qty 5

## 2015-04-03 MED ORDER — SODIUM CHLORIDE 0.9 % IV SOLN
Freq: Once | INTRAVENOUS | Status: AC
  Administered 2015-04-03: 14:00:00 via INTRAVENOUS
  Filled 2015-04-03: qty 4

## 2015-04-03 NOTE — Progress Notes (Signed)
Patient reports a rash on bilaterally on hands and lower arms. States it started after her last infusion.

## 2015-04-04 NOTE — Progress Notes (Signed)
[place]  Telephone:(336) [phone] Fax:(336) [phone]  ID: Amy Mckenzie OB: December 02, 1920  MR#: [id number]  LOV#:[id number]  Patient Care Team: No Pcp Per Patient as PCP - General (General Practice)  CHIEF COMPLAINT:  Chief Complaint  Patient presents with  . Follow-up    Pancreatic cancer  . Chemotherapy    INTERVAL HISTORY: Patient returns to clinic today for further evaluation and consideration of cycle 2, day 15 of gemcitabine and Abraxane. She has had no further falls. She does not complain of weakness and fatigue today. She has no neurologic complaints. She denies any recent fevers or illnesses. She denies any weight loss. She has no chest pain or shortness of breath. She denies any abdominal pain. She denies any nausea, vomiting, constipation, or diarrhea. She has no urinary complaints. Patient offers no further complaints today.  REVIEW OF SYSTEMS:   Review of Systems  Respiratory: Negative.   Cardiovascular: Negative.   Neurological: Negative.  Negative for weakness.    As per HPI. Otherwise, a complete review of systems is negatve.  PAST MEDICAL HISTORY: Past Medical History  Diagnosis Date  . Hypothyroidism   . Skin cancer   . Pancreatic cancer   . GERD (gastroesophageal reflux disease)   . Diabetes mellitus without complication   . Osteoporosis     PAST SURGICAL HISTORY: Past Surgical History  Procedure Laterality Date  . Cholecystectomy      FAMILY HISTORY History reviewed. No pertinent family history.     ADVANCED DIRECTIVES:    HEALTH MAINTENANCE: History  Substance Use Topics  . Smoking status: Never Smoker   . Smokeless tobacco: Not on file  . Alcohol Use: Yes     Comment: occasional     Colonoscopy:  PAP:  Bone density:  Lipid panel:  No Known Allergies  Current Outpatient Prescriptions  Medication Sig Dispense Refill  . aspirin 81 MG tablet Take 81 mg by mouth daily.    Marland Kitchen levothyroxine (SYNTHROID,  LEVOTHROID) 25 MCG tablet   3  . lidocaine-prilocaine (EMLA) cream     . omeprazole (PRILOSEC) 20 MG capsule Take 20 mg by mouth daily.    . prochlorperazine (COMPAZINE) 10 MG tablet   0  . propranolol (INDERAL) 40 MG tablet Take 40 mg by mouth 2 (two) times daily.    . raloxifene (EVISTA) 60 MG tablet Take 60 mg by mouth daily.     No current facility-administered medications for this visit.    OBJECTIVE: Filed Vitals:   04/03/15 1159  BP: 133/82  Pulse: 61  Temp: 95.6 F (35.3 C)  Resp: 16     Body mass index is 21.06 kg/(m^2).    ECOG FS:1 - Symptomatic but completely ambulatory  General: Well-developed, well-nourished, no acute distress. Eyes: anicteric sclera. Lungs: Clear to auscultation bilaterally. Heart: Regular rate and rhythm. No rubs, murmurs, or gallops. Abdomen: Soft, nontender, nondistended. No organomegaly noted, normoactive bowel sounds. Musculoskeletal: No edema, cyanosis, or clubbing. Neuro: Alert, answering all questions appropriately. Cranial nerves grossly intact. Skin: No rashes or petechiae noted. Psych: Normal affect.   LAB RESULTS:  Lab Results  Component Value Date   NA 133* 04/03/2015   K 3.7 04/03/2015   CL 101 04/03/2015   CO2 27 04/03/2015   GLUCOSE 199* 04/03/2015   BUN 17 04/03/2015   CREATININE 0.58 04/03/2015   CALCIUM 8.2* 04/03/2015   PROT 6.1* 04/03/2015   ALBUMIN 3.1* 04/03/2015   AST 19 04/03/2015   ALT 14 04/03/2015   ALKPHOS  54 04/03/2015   BILITOT 0.5 04/03/2015   GFRNONAA >60 04/03/2015   GFRAA >60 04/03/2015    Lab Results  Component Value Date   WBC 2.1* 04/03/2015   NEUTROABS 1.1* 04/03/2015   HGB 10.2* 04/03/2015   HCT 30.5* 04/03/2015   MCV 97.9 04/03/2015   PLT 175 04/03/2015     STUDIES: No results found.  ASSESSMENT: Stage IV pancreatic cancer with liver metastasis.  PLAN:    1. Pancreatic cancer: PET scan results reviewed independently and confirm metastatic disease in liver. Previously,  cytology from ERCP confirmed adenocarcinoma. CA-19-9 is now trending down significantly. Proceed with cycle 2, day 15 of gemcitabine and Abraxane . Will dose reduce chemotherapy from the onset given her advanced age. Plan to give this treatment on days 1, 8, and 15 with day 22 off. Will reimage at the conclusion of cycle 3. Return to clinic in 2 weeks for consideration of cycle 3, day 1.  2. Hyperbilirubinemia: Bilirubin is now within normal limits after stenting and ERCP. Monitor. 3. Hyperglycemia: Monitor. 4. Leukopenia: Improved. 5. Neutropenia: Secondary to chemotherapy, monitor. 6. Anemia: Secondary chemotherapy, patient is approximately her baseline. Monitor.  Patient expressed understanding and was in agreement with this plan. She also understands that She can call clinic at any time with any questions, concerns, or complaints.   Pancreatic cancer metastasized to liver   Staging form: Pancreas, AJCC 7th Edition     Clinical stage from 03/29/2015: Stage IV (TX, N0, M1) - Signed by Lloyd Huger, MD on 03/29/2015   Lloyd Huger, MD   04/04/2015 2:18 PM

## 2015-04-17 ENCOUNTER — Inpatient Hospital Stay: Payer: Medicare Other

## 2015-04-17 ENCOUNTER — Other Ambulatory Visit: Payer: Self-pay | Admitting: Oncology

## 2015-04-17 ENCOUNTER — Inpatient Hospital Stay (HOSPITAL_BASED_OUTPATIENT_CLINIC_OR_DEPARTMENT_OTHER): Payer: Medicare Other | Admitting: Oncology

## 2015-04-17 VITALS — BP 126/79 | HR 64 | Temp 97.5°F | Resp 20 | Wt 115.7 lb

## 2015-04-17 DIAGNOSIS — C787 Secondary malignant neoplasm of liver and intrahepatic bile duct: Secondary | ICD-10-CM | POA: Diagnosis not present

## 2015-04-17 DIAGNOSIS — T451X5S Adverse effect of antineoplastic and immunosuppressive drugs, sequela: Secondary | ICD-10-CM

## 2015-04-17 DIAGNOSIS — E1165 Type 2 diabetes mellitus with hyperglycemia: Secondary | ICD-10-CM

## 2015-04-17 DIAGNOSIS — M810 Age-related osteoporosis without current pathological fracture: Secondary | ICD-10-CM

## 2015-04-17 DIAGNOSIS — E119 Type 2 diabetes mellitus without complications: Secondary | ICD-10-CM

## 2015-04-17 DIAGNOSIS — C251 Malignant neoplasm of body of pancreas: Secondary | ICD-10-CM

## 2015-04-17 DIAGNOSIS — K219 Gastro-esophageal reflux disease without esophagitis: Secondary | ICD-10-CM

## 2015-04-17 DIAGNOSIS — Z7982 Long term (current) use of aspirin: Secondary | ICD-10-CM

## 2015-04-17 DIAGNOSIS — C250 Malignant neoplasm of head of pancreas: Secondary | ICD-10-CM | POA: Diagnosis not present

## 2015-04-17 DIAGNOSIS — Z5111 Encounter for antineoplastic chemotherapy: Secondary | ICD-10-CM | POA: Diagnosis not present

## 2015-04-17 DIAGNOSIS — C259 Malignant neoplasm of pancreas, unspecified: Secondary | ICD-10-CM

## 2015-04-17 DIAGNOSIS — D701 Agranulocytosis secondary to cancer chemotherapy: Secondary | ICD-10-CM | POA: Diagnosis not present

## 2015-04-17 DIAGNOSIS — D6481 Anemia due to antineoplastic chemotherapy: Secondary | ICD-10-CM

## 2015-04-17 DIAGNOSIS — E039 Hypothyroidism, unspecified: Secondary | ICD-10-CM

## 2015-04-17 DIAGNOSIS — Z79899 Other long term (current) drug therapy: Secondary | ICD-10-CM

## 2015-04-17 LAB — COMPREHENSIVE METABOLIC PANEL
ALBUMIN: 3.1 g/dL — AB (ref 3.5–5.0)
ALK PHOS: 55 U/L (ref 38–126)
ALT: 14 U/L (ref 14–54)
AST: 22 U/L (ref 15–41)
Anion gap: 4 — ABNORMAL LOW (ref 5–15)
BILIRUBIN TOTAL: 0.5 mg/dL (ref 0.3–1.2)
BUN: 17 mg/dL (ref 6–20)
CHLORIDE: 98 mmol/L — AB (ref 101–111)
CO2: 30 mmol/L (ref 22–32)
Calcium: 8 mg/dL — ABNORMAL LOW (ref 8.9–10.3)
Creatinine, Ser: 0.66 mg/dL (ref 0.44–1.00)
GFR calc Af Amer: >60 mL/min (ref >60)
GFR calc non Af Amer: >60 mL/min (ref >60)
Glucose, Bld: 159 mg/dL — ABNORMAL HIGH (ref 65–99)
Potassium: 3.6 mmol/L (ref 3.5–5.1)
Sodium: 132 mmol/L — ABNORMAL LOW (ref 135–145)
TOTAL PROTEIN: 5.9 g/dL — AB (ref 6.5–8.1)

## 2015-04-17 LAB — CBC WITH DIFFERENTIAL/PLATELET
BASOS ABS: 0.1 10*3/uL (ref 0–0.1)
BASOS PCT: 2 %
Eosinophils Absolute: 0.1 10*3/uL (ref 0–0.7)
Eosinophils Relative: 2 %
HCT: 31.4 % — ABNORMAL LOW (ref 35.0–47.0)
HEMOGLOBIN: 10.4 g/dL — AB (ref 12.0–16.0)
LYMPHS PCT: 25 %
Lymphs Abs: 1.3 10*3/uL (ref 1.0–3.6)
MCH: 33 pg (ref 26.0–34.0)
MCHC: 33.3 g/dL (ref 32.0–36.0)
MCV: 99 fL (ref 80.0–100.0)
Monocytes Absolute: 0.9 10*3/uL (ref 0.2–0.9)
Monocytes Relative: 17 %
NEUTROS ABS: 2.9 10*3/uL (ref 1.4–6.5)
Neutrophils Relative %: 54 %
PLATELETS: 378 10*3/uL (ref 150–440)
RBC: 3.17 MIL/uL — AB (ref 3.80–5.20)
RDW: 16.3 % — ABNORMAL HIGH (ref 11.5–14.5)
WBC: 5.5 10*3/uL (ref 3.6–11.0)

## 2015-04-17 MED ORDER — SODIUM CHLORIDE 0.9 % IJ SOLN
10.0000 mL | INTRAMUSCULAR | Status: DC | PRN
  Administered 2015-04-17: 10 mL
  Filled 2015-04-17: qty 10

## 2015-04-17 MED ORDER — PACLITAXEL PROTEIN-BOUND CHEMO INJECTION 100 MG
100.0000 mg/m2 | Freq: Once | INTRAVENOUS | Status: AC
  Administered 2015-04-17: 150 mg via INTRAVENOUS
  Filled 2015-04-17: qty 30

## 2015-04-17 MED ORDER — SODIUM CHLORIDE 0.9 % IV SOLN
Freq: Once | INTRAVENOUS | Status: AC
  Administered 2015-04-17: 15:00:00 via INTRAVENOUS
  Filled 2015-04-17: qty 4

## 2015-04-17 MED ORDER — HEPARIN SOD (PORK) LOCK FLUSH 100 UNIT/ML IV SOLN
500.0000 [IU] | Freq: Once | INTRAVENOUS | Status: AC | PRN
  Administered 2015-04-17: 500 [IU]

## 2015-04-17 MED ORDER — SODIUM CHLORIDE 0.9 % IV SOLN
Freq: Once | INTRAVENOUS | Status: AC
  Administered 2015-04-17: 15:00:00 via INTRAVENOUS
  Filled 2015-04-17: qty 250

## 2015-04-17 MED ORDER — SODIUM CHLORIDE 0.9 % IV SOLN
800.0000 mg/m2 | Freq: Once | INTRAVENOUS | Status: AC
  Administered 2015-04-17: 1178 mg via INTRAVENOUS
  Filled 2015-04-17: qty 25.74

## 2015-04-18 LAB — CANCER ANTIGEN 19-9: CA 19 9: 1212 U/mL — AB (ref 0–35)

## 2015-04-24 ENCOUNTER — Encounter: Payer: Self-pay | Admitting: Oncology

## 2015-04-24 ENCOUNTER — Inpatient Hospital Stay: Payer: Medicare Other

## 2015-04-24 ENCOUNTER — Inpatient Hospital Stay (HOSPITAL_BASED_OUTPATIENT_CLINIC_OR_DEPARTMENT_OTHER): Payer: Medicare Other | Admitting: Oncology

## 2015-04-24 VITALS — BP 150/73 | HR 60 | Temp 98.0°F | Resp 20

## 2015-04-24 VITALS — BP 125/76 | HR 60 | Temp 98.0°F | Ht 62.0 in | Wt 116.0 lb

## 2015-04-24 DIAGNOSIS — C259 Malignant neoplasm of pancreas, unspecified: Secondary | ICD-10-CM

## 2015-04-24 DIAGNOSIS — K219 Gastro-esophageal reflux disease without esophagitis: Secondary | ICD-10-CM

## 2015-04-24 DIAGNOSIS — D6481 Anemia due to antineoplastic chemotherapy: Secondary | ICD-10-CM | POA: Diagnosis not present

## 2015-04-24 DIAGNOSIS — Z79899 Other long term (current) drug therapy: Secondary | ICD-10-CM

## 2015-04-24 DIAGNOSIS — D701 Agranulocytosis secondary to cancer chemotherapy: Secondary | ICD-10-CM | POA: Diagnosis not present

## 2015-04-24 DIAGNOSIS — T451X5S Adverse effect of antineoplastic and immunosuppressive drugs, sequela: Secondary | ICD-10-CM

## 2015-04-24 DIAGNOSIS — C787 Secondary malignant neoplasm of liver and intrahepatic bile duct: Secondary | ICD-10-CM | POA: Diagnosis not present

## 2015-04-24 DIAGNOSIS — C251 Malignant neoplasm of body of pancreas: Secondary | ICD-10-CM | POA: Diagnosis not present

## 2015-04-24 DIAGNOSIS — Z5111 Encounter for antineoplastic chemotherapy: Secondary | ICD-10-CM | POA: Diagnosis not present

## 2015-04-24 DIAGNOSIS — E039 Hypothyroidism, unspecified: Secondary | ICD-10-CM

## 2015-04-24 DIAGNOSIS — Z7982 Long term (current) use of aspirin: Secondary | ICD-10-CM

## 2015-04-24 DIAGNOSIS — M810 Age-related osteoporosis without current pathological fracture: Secondary | ICD-10-CM

## 2015-04-24 DIAGNOSIS — E1165 Type 2 diabetes mellitus with hyperglycemia: Secondary | ICD-10-CM

## 2015-04-24 LAB — COMPREHENSIVE METABOLIC PANEL
ALBUMIN: 2.9 g/dL — AB (ref 3.5–5.0)
ALT: 18 U/L (ref 14–54)
AST: 25 U/L (ref 15–41)
Alkaline Phosphatase: 50 U/L (ref 38–126)
Anion gap: 2 — ABNORMAL LOW (ref 5–15)
BILIRUBIN TOTAL: 0.3 mg/dL (ref 0.3–1.2)
BUN: 23 mg/dL — ABNORMAL HIGH (ref 6–20)
CALCIUM: 7.6 mg/dL — AB (ref 8.9–10.3)
CHLORIDE: 100 mmol/L — AB (ref 101–111)
CO2: 27 mmol/L (ref 22–32)
CREATININE: 0.52 mg/dL (ref 0.44–1.00)
GFR calc Af Amer: >60 mL/min (ref >60)
Glucose, Bld: 140 mg/dL — ABNORMAL HIGH (ref 65–99)
Potassium: 4 mmol/L (ref 3.5–5.1)
Sodium: 129 mmol/L — ABNORMAL LOW (ref 135–145)
TOTAL PROTEIN: 5.6 g/dL — AB (ref 6.5–8.1)

## 2015-04-24 LAB — CBC WITH DIFFERENTIAL/PLATELET
BASOS ABS: 0 10*3/uL (ref 0–0.1)
Basophils Relative: 1 %
EOS PCT: 0 %
Eosinophils Absolute: 0 10*3/uL (ref 0–0.7)
HCT: 29.2 % — ABNORMAL LOW (ref 35.0–47.0)
Hemoglobin: 9.7 g/dL — ABNORMAL LOW (ref 12.0–16.0)
Lymphocytes Relative: 22 %
Lymphs Abs: 0.7 10*3/uL — ABNORMAL LOW (ref 1.0–3.6)
MCH: 32.8 pg (ref 26.0–34.0)
MCHC: 33.1 g/dL (ref 32.0–36.0)
MCV: 99 fL (ref 80.0–100.0)
MONO ABS: 0.4 10*3/uL (ref 0.2–0.9)
MONOS PCT: 11 %
NEUTROS ABS: 2.1 10*3/uL (ref 1.4–6.5)
NEUTROS PCT: 66 %
PLATELETS: 282 10*3/uL (ref 150–440)
RBC: 2.95 MIL/uL — ABNORMAL LOW (ref 3.80–5.20)
RDW: 15 % — ABNORMAL HIGH (ref 11.5–14.5)
WBC: 3.2 10*3/uL — AB (ref 3.6–11.0)

## 2015-04-24 MED ORDER — SODIUM CHLORIDE 0.9 % IV SOLN
800.0000 mg/m2 | Freq: Once | INTRAVENOUS | Status: AC
  Administered 2015-04-24: 1178 mg via INTRAVENOUS
  Filled 2015-04-24: qty 26.3

## 2015-04-24 MED ORDER — SODIUM CHLORIDE 0.9 % IJ SOLN
10.0000 mL | INTRAMUSCULAR | Status: DC | PRN
  Filled 2015-04-24: qty 10

## 2015-04-24 MED ORDER — SODIUM CHLORIDE 0.9 % IV SOLN
Freq: Once | INTRAVENOUS | Status: AC
  Administered 2015-04-24: 13:00:00 via INTRAVENOUS
  Filled 2015-04-24: qty 4

## 2015-04-24 MED ORDER — SODIUM CHLORIDE 0.9 % IV SOLN
Freq: Once | INTRAVENOUS | Status: AC
  Administered 2015-04-24: 13:00:00 via INTRAVENOUS
  Filled 2015-04-24: qty 250

## 2015-04-24 MED ORDER — PACLITAXEL PROTEIN-BOUND CHEMO INJECTION 100 MG
100.0000 mg/m2 | Freq: Once | INTRAVENOUS | Status: AC
  Administered 2015-04-24: 150 mg via INTRAVENOUS
  Filled 2015-04-24: qty 30

## 2015-04-24 MED ORDER — HEPARIN SOD (PORK) LOCK FLUSH 100 UNIT/ML IV SOLN
500.0000 [IU] | Freq: Once | INTRAVENOUS | Status: DC | PRN
  Filled 2015-04-24: qty 5

## 2015-05-01 ENCOUNTER — Inpatient Hospital Stay: Payer: Medicare Other

## 2015-05-01 ENCOUNTER — Inpatient Hospital Stay: Payer: Medicare Other | Attending: Oncology

## 2015-05-01 ENCOUNTER — Inpatient Hospital Stay (HOSPITAL_BASED_OUTPATIENT_CLINIC_OR_DEPARTMENT_OTHER): Payer: Medicare Other | Admitting: Oncology

## 2015-05-01 VITALS — BP 120/71 | HR 63 | Temp 97.4°F | Resp 20 | Wt 108.2 lb

## 2015-05-01 DIAGNOSIS — C787 Secondary malignant neoplasm of liver and intrahepatic bile duct: Secondary | ICD-10-CM

## 2015-05-01 DIAGNOSIS — Z7982 Long term (current) use of aspirin: Secondary | ICD-10-CM | POA: Diagnosis not present

## 2015-05-01 DIAGNOSIS — E1165 Type 2 diabetes mellitus with hyperglycemia: Secondary | ICD-10-CM

## 2015-05-01 DIAGNOSIS — T451X5S Adverse effect of antineoplastic and immunosuppressive drugs, sequela: Secondary | ICD-10-CM | POA: Diagnosis not present

## 2015-05-01 DIAGNOSIS — E701 Other hyperphenylalaninemias: Secondary | ICD-10-CM | POA: Diagnosis not present

## 2015-05-01 DIAGNOSIS — E871 Hypo-osmolality and hyponatremia: Secondary | ICD-10-CM | POA: Insufficient documentation

## 2015-05-01 DIAGNOSIS — C250 Malignant neoplasm of head of pancreas: Secondary | ICD-10-CM | POA: Diagnosis not present

## 2015-05-01 DIAGNOSIS — C259 Malignant neoplasm of pancreas, unspecified: Secondary | ICD-10-CM

## 2015-05-01 DIAGNOSIS — I2510 Atherosclerotic heart disease of native coronary artery without angina pectoris: Secondary | ICD-10-CM | POA: Insufficient documentation

## 2015-05-01 DIAGNOSIS — D6481 Anemia due to antineoplastic chemotherapy: Secondary | ICD-10-CM | POA: Insufficient documentation

## 2015-05-01 DIAGNOSIS — K219 Gastro-esophageal reflux disease without esophagitis: Secondary | ICD-10-CM | POA: Diagnosis not present

## 2015-05-01 DIAGNOSIS — E039 Hypothyroidism, unspecified: Secondary | ICD-10-CM | POA: Diagnosis not present

## 2015-05-01 DIAGNOSIS — M810 Age-related osteoporosis without current pathological fracture: Secondary | ICD-10-CM | POA: Insufficient documentation

## 2015-05-01 DIAGNOSIS — J90 Pleural effusion, not elsewhere classified: Secondary | ICD-10-CM | POA: Diagnosis not present

## 2015-05-01 DIAGNOSIS — Z5111 Encounter for antineoplastic chemotherapy: Secondary | ICD-10-CM | POA: Insufficient documentation

## 2015-05-01 DIAGNOSIS — C251 Malignant neoplasm of body of pancreas: Secondary | ICD-10-CM

## 2015-05-01 DIAGNOSIS — Z79899 Other long term (current) drug therapy: Secondary | ICD-10-CM

## 2015-05-01 LAB — COMPREHENSIVE METABOLIC PANEL
ALT: 17 U/L (ref 14–54)
ANION GAP: 5 (ref 5–15)
AST: 25 U/L (ref 15–41)
Albumin: 2.9 g/dL — ABNORMAL LOW (ref 3.5–5.0)
Alkaline Phosphatase: 52 U/L (ref 38–126)
BUN: 18 mg/dL (ref 6–20)
CHLORIDE: 100 mmol/L — AB (ref 101–111)
CO2: 26 mmol/L (ref 22–32)
CREATININE: 0.58 mg/dL (ref 0.44–1.00)
Calcium: 7.8 mg/dL — ABNORMAL LOW (ref 8.9–10.3)
GFR calc non Af Amer: >60 mL/min (ref >60)
Glucose, Bld: 207 mg/dL — ABNORMAL HIGH (ref 65–99)
POTASSIUM: 3.6 mmol/L (ref 3.5–5.1)
SODIUM: 131 mmol/L — AB (ref 135–145)
TOTAL PROTEIN: 5.6 g/dL — AB (ref 6.5–8.1)
Total Bilirubin: 0.6 mg/dL (ref 0.3–1.2)

## 2015-05-01 LAB — CBC WITH DIFFERENTIAL/PLATELET
Basophils Absolute: 0 10*3/uL (ref 0–0.1)
Basophils Relative: 1 %
EOS PCT: 0 %
Eosinophils Absolute: 0 10*3/uL (ref 0–0.7)
HCT: 28.7 % — ABNORMAL LOW (ref 35.0–47.0)
Hemoglobin: 9.5 g/dL — ABNORMAL LOW (ref 12.0–16.0)
LYMPHS ABS: 0.6 10*3/uL — AB (ref 1.0–3.6)
LYMPHS PCT: 26 %
MCH: 32.7 pg (ref 26.0–34.0)
MCHC: 33.1 g/dL (ref 32.0–36.0)
MCV: 99 fL (ref 80.0–100.0)
MONO ABS: 0.3 10*3/uL (ref 0.2–0.9)
Monocytes Relative: 13 %
Neutro Abs: 1.4 10*3/uL (ref 1.4–6.5)
Neutrophils Relative %: 60 %
Platelets: 173 10*3/uL (ref 150–440)
RBC: 2.9 MIL/uL — ABNORMAL LOW (ref 3.80–5.20)
RDW: 15.1 % — ABNORMAL HIGH (ref 11.5–14.5)
WBC: 2.3 10*3/uL — ABNORMAL LOW (ref 3.6–11.0)

## 2015-05-01 MED ORDER — SODIUM CHLORIDE 0.9 % IV SOLN
Freq: Once | INTRAVENOUS | Status: AC
  Administered 2015-05-01: 11:00:00 via INTRAVENOUS
  Filled 2015-05-01: qty 1000

## 2015-05-01 MED ORDER — HEPARIN SOD (PORK) LOCK FLUSH 100 UNIT/ML IV SOLN
500.0000 [IU] | Freq: Once | INTRAVENOUS | Status: AC | PRN
  Administered 2015-05-01: 500 [IU]

## 2015-05-01 MED ORDER — SODIUM CHLORIDE 0.9 % IV SOLN
Freq: Once | INTRAVENOUS | Status: AC
  Administered 2015-05-01: 11:00:00 via INTRAVENOUS
  Filled 2015-05-01: qty 4

## 2015-05-01 MED ORDER — SODIUM CHLORIDE 0.9 % IV SOLN
800.0000 mg/m2 | Freq: Once | INTRAVENOUS | Status: AC
  Administered 2015-05-01: 1178 mg via INTRAVENOUS
  Filled 2015-05-01: qty 25.72

## 2015-05-01 MED ORDER — PACLITAXEL PROTEIN-BOUND CHEMO INJECTION 100 MG
100.0000 mg/m2 | Freq: Once | INTRAVENOUS | Status: AC
  Administered 2015-05-01: 150 mg via INTRAVENOUS
  Filled 2015-05-01: qty 30

## 2015-05-03 NOTE — Progress Notes (Signed)
[place]  Telephone:(336) [phone] Fax:(336) [phone]  ID: Amy Mckenzie OB: 08-28-1921  MR#: [id number]  AOZ#:[id number]  Patient Care Team: Manya Silvas, MD as PCP - General (Gastroenterology)  CHIEF COMPLAINT:  Chief Complaint  Patient presents with  . Follow-up    pancreatic cancer  . Chemotherapy    gem/abraxane    INTERVAL HISTORY: Patient returns to clinic today for further evaluation and consideration of cycle 3, day 1 of gemcitabine and Abraxane. She does not complain of weakness and fatigue today. She has no neurologic complaints. She denies any recent fevers or illnesses. She denies any weight loss. She has no chest pain or shortness of breath. She denies any abdominal pain. She denies any nausea, vomiting, constipation, or diarrhea. She has no urinary complaints. Patient offers no specific complaints today.  REVIEW OF SYSTEMS:   Review of Systems  Respiratory: Negative.   Cardiovascular: Negative.   Gastrointestinal: Negative.   Neurological: Positive for weakness.    As per HPI. Otherwise, a complete review of systems is negatve.  PAST MEDICAL HISTORY: Past Medical History  Diagnosis Date  . Hypothyroidism   . Skin cancer   . Pancreatic cancer   . GERD (gastroesophageal reflux disease)   . Diabetes mellitus without complication   . Osteoporosis     PAST SURGICAL HISTORY: Past Surgical History  Procedure Laterality Date  . Cholecystectomy      FAMILY HISTORY: Reviewed and unchanged. No reported history of chronic disease or malignancy.     ADVANCED DIRECTIVES:    HEALTH MAINTENANCE: History  Substance Use Topics  . Smoking status: Never Smoker   . Smokeless tobacco: Never Used  . Alcohol Use: Yes     Comment: occasional     Colonoscopy:  PAP:  Bone density:  Lipid panel:  No Known Allergies  Current Outpatient Prescriptions  Medication Sig Dispense Refill  . aspirin 81 MG tablet Take 81 mg by mouth daily.      . furosemide (LASIX) 40 MG tablet   5  . levothyroxine (SYNTHROID, LEVOTHROID) 25 MCG tablet   3  . lidocaine-prilocaine (EMLA) cream     . omeprazole (PRILOSEC) 20 MG capsule Take 20 mg by mouth daily.    . prochlorperazine (COMPAZINE) 10 MG tablet   0  . propranolol (INDERAL) 40 MG tablet Take 40 mg by mouth 2 (two) times daily.    . raloxifene (EVISTA) 60 MG tablet Take 60 mg by mouth daily.     No current facility-administered medications for this visit.    OBJECTIVE: Filed Vitals:   04/17/15 1506  BP: 126/79  Pulse: 64  Temp: 97.5 F (36.4 C)  Resp: 20     Body mass index is 21.3 kg/(m^2).    ECOG FS:1 - Symptomatic but completely ambulatory  General: Well-developed, well-nourished, no acute distress. Eyes: anicteric sclera. Lungs: Clear to auscultation bilaterally. Heart: Regular rate and rhythm. No rubs, murmurs, or gallops. Abdomen: Soft, nontender, nondistended. No organomegaly noted, normoactive bowel sounds. Musculoskeletal: No edema, cyanosis, or clubbing. Neuro: Alert, answering all questions appropriately. Cranial nerves grossly intact. Skin: No rashes or petechiae noted. Psych: Normal affect.   LAB RESULTS:  Lab Results  Component Value Date   NA 131* 05/01/2015   K 3.6 05/01/2015   CL 100* 05/01/2015   CO2 26 05/01/2015   GLUCOSE 207* 05/01/2015   BUN 18 05/01/2015   CREATININE 0.58 05/01/2015   CALCIUM 7.8* 05/01/2015   PROT 5.6* 05/01/2015   ALBUMIN 2.9*  05/01/2015   AST 25 05/01/2015   ALT 17 05/01/2015   ALKPHOS 52 05/01/2015   BILITOT 0.6 05/01/2015   GFRNONAA >60 05/01/2015   GFRAA >60 05/01/2015    Lab Results  Component Value Date   WBC 2.3* 05/01/2015   NEUTROABS 1.4 05/01/2015   HGB 9.5* 05/01/2015   HCT 28.7* 05/01/2015   MCV 99.0 05/01/2015   PLT 173 05/01/2015     STUDIES: No results found.  ASSESSMENT: Stage IV pancreatic cancer with liver metastasis.  PLAN:    1. Pancreatic cancer: PET scan results reviewed  independently and confirm metastatic disease in liver. Previously, cytology from ERCP confirmed adenocarcinoma. CA-19-9 is now trending down significantly at 1212 (Started ~12,000). Proceed with cycle 3, day 1 of gemcitabine and Abraxane . Will dose reduce chemotherapy from the onset given her advanced age. Plan to give this treatment on days 1, 8, and 15 with day 22 off. Will reimage at the conclusion of cycle 3. Return to clinic in 1 week for consideration of cycle 3, day 8.  2. Hyperbilirubinemia: Bilirubin is now within normal limits after stenting and ERCP. Monitor. 3. Hyperglycemia: Monitor. 4. Neutropenia: Secondary to chemotherapy, monitor. 5. Anemia: Secondary chemotherapy, patient is approximately her baseline. Monitor.  Patient expressed understanding and was in agreement with this plan. She also understands that She can call clinic at any time with any questions, concerns, or complaints.   Pancreatic cancer metastasized to liver   Staging form: Pancreas, AJCC 7th Edition     Clinical stage from 03/29/2015: Stage IV (TX, N0, M1) - Signed by Lloyd Huger, MD on 03/29/2015   Lloyd Huger, MD   05/03/2015 9:25 AM

## 2015-05-03 NOTE — Progress Notes (Signed)
[place]  Telephone:(336) [phone] Fax:(336) [phone]  ID: Amy Mckenzie OB: Aug 10, 1921  MR#: [id number]  HGD#:[id number]  Patient Care Team: Manya Silvas, MD as PCP - General (Gastroenterology)  CHIEF COMPLAINT:  Chief Complaint  Patient presents with  . Follow-up    Pancreatic Cancer    INTERVAL HISTORY: Patient returns to clinic today for further evaluation and consideration of cycle 3, day 8 of gemcitabine and Abraxane. She currently feels well and is asymptomatic. She has no neurologic complaints. She denies any recent fevers or illnesses. She denies any weight loss. She has no chest pain or shortness of breath. She denies any abdominal pain. She denies any nausea, vomiting, constipation, or diarrhea. She has no urinary complaints. Patient offers no specific complaints today.  REVIEW OF SYSTEMS:   Review of Systems  Constitutional: Negative.   Respiratory: Negative.   Cardiovascular: Negative.   Gastrointestinal: Negative.     As per HPI. Otherwise, a complete review of systems is negatve.  PAST MEDICAL HISTORY: Past Medical History  Diagnosis Date  . Hypothyroidism   . Skin cancer   . Pancreatic cancer   . GERD (gastroesophageal reflux disease)   . Diabetes mellitus without complication   . Osteoporosis     PAST SURGICAL HISTORY: Past Surgical History  Procedure Laterality Date  . Cholecystectomy      FAMILY HISTORY: Reviewed and unchanged. No reported history of chronic disease or malignancy.     ADVANCED DIRECTIVES:    HEALTH MAINTENANCE: History  Substance Use Topics  . Smoking status: Never Smoker   . Smokeless tobacco: Never Used  . Alcohol Use: Yes     Comment: occasional     Colonoscopy:  PAP:  Bone density:  Lipid panel:  No Known Allergies  Current Outpatient Prescriptions  Medication Sig Dispense Refill  . aspirin 81 MG tablet Take 81 mg by mouth daily.    . furosemide (LASIX) 40 MG tablet   5  .  levothyroxine (SYNTHROID, LEVOTHROID) 25 MCG tablet   3  . lidocaine-prilocaine (EMLA) cream     . omeprazole (PRILOSEC) 20 MG capsule Take 20 mg by mouth daily.    . prochlorperazine (COMPAZINE) 10 MG tablet   0  . propranolol (INDERAL) 40 MG tablet Take 40 mg by mouth 2 (two) times daily.    . raloxifene (EVISTA) 60 MG tablet Take 60 mg by mouth daily.     No current facility-administered medications for this visit.    OBJECTIVE: Filed Vitals:   04/24/15 1152  BP: 125/76  Pulse: 60  Temp: 98 F (36.7 C)     Body mass index is 21.2 kg/(m^2).    ECOG FS:1 - Symptomatic but completely ambulatory  General: Well-developed, well-nourished, no acute distress. Eyes: anicteric sclera. Lungs: Clear to auscultation bilaterally. Heart: Regular rate and rhythm. No rubs, murmurs, or gallops. Abdomen: Soft, nontender, nondistended. No organomegaly noted, normoactive bowel sounds. Musculoskeletal: No edema, cyanosis, or clubbing. Neuro: Alert, answering all questions appropriately. Cranial nerves grossly intact. Skin: No rashes or petechiae noted. Psych: Normal affect.   LAB RESULTS:  Lab Results  Component Value Date   NA 131* 05/01/2015   K 3.6 05/01/2015   CL 100* 05/01/2015   CO2 26 05/01/2015   GLUCOSE 207* 05/01/2015   BUN 18 05/01/2015   CREATININE 0.58 05/01/2015   CALCIUM 7.8* 05/01/2015   PROT 5.6* 05/01/2015   ALBUMIN 2.9* 05/01/2015   AST 25 05/01/2015   ALT 17 05/01/2015   ALKPHOS  52 05/01/2015   BILITOT 0.6 05/01/2015   GFRNONAA >60 05/01/2015   GFRAA >60 05/01/2015    Lab Results  Component Value Date   WBC 2.3* 05/01/2015   NEUTROABS 1.4 05/01/2015   HGB 9.5* 05/01/2015   HCT 28.7* 05/01/2015   MCV 99.0 05/01/2015   PLT 173 05/01/2015     STUDIES: No results found.  ASSESSMENT: Stage IV pancreatic cancer with liver metastasis.  PLAN:    1. Pancreatic cancer: PET scan results reviewed independently and confirm metastatic disease in liver.  Previously, cytology from ERCP confirmed adenocarcinoma. CA-19-9 is now trending down significantly at 1212 (Started ~12,000). Proceed with cycle 3, day 8 of gemcitabine and Abraxane . Will dose reduce chemotherapy from the onset given her advanced age. Plan to give this treatment on days 1, 8, and 15 with day 22 off. Will reimage at the conclusion of cycle 3. Return to clinic in 1 week for consideration of cycle 3, day 15.  2. Hyperbilirubinemia: Bilirubin is now within normal limits after stenting and ERCP. Monitor. 3. Hyperglycemia: Monitor. 4. Neutropenia: Secondary to chemotherapy, monitor. 5. Anemia: Secondary chemotherapy, patient is approximately her baseline. Monitor.  Patient expressed understanding and was in agreement with this plan. She also understands that She can call clinic at any time with any questions, concerns, or complaints.   Pancreatic cancer metastasized to liver   Staging form: Pancreas, AJCC 7th Edition     Clinical stage from 03/29/2015: Stage IV (TX, N0, M1) - Signed by Lloyd Huger, MD on 03/29/2015   Lloyd Huger, MD   05/03/2015 9:32 AM

## 2015-05-03 NOTE — Progress Notes (Signed)
[place]  Telephone:(336) [phone] Fax:(336) [phone]  ID: Jenipher Havel OB: 09/21/1921  MR#: [id number]  EHM#:[id number]  Patient Care Team: Manya Silvas, MD as PCP - General (Gastroenterology)  CHIEF COMPLAINT:  Chief Complaint  Patient presents with  . Follow-up    pancreatic cancer  . Chemotherapy    INTERVAL HISTORY: Patient returns to clinic today for further evaluation and consideration of cycle 3, day 15 of gemcitabine and Abraxane. She currently feels well and is asymptomatic. She has no neurologic complaints. She denies any recent fevers or illnesses. She denies any weight loss. She has no chest pain or shortness of breath. She denies any abdominal pain. She denies any nausea, vomiting, constipation, or diarrhea. She has no urinary complaints. Patient offers no specific complaints today.  REVIEW OF SYSTEMS:   Review of Systems  Constitutional: Negative.   Eyes: Negative.   Cardiovascular: Negative.   Gastrointestinal: Negative.     As per HPI. Otherwise, a complete review of systems is negatve.  PAST MEDICAL HISTORY: Past Medical History  Diagnosis Date  . Hypothyroidism   . Skin cancer   . Pancreatic cancer   . GERD (gastroesophageal reflux disease)   . Diabetes mellitus without complication   . Osteoporosis     PAST SURGICAL HISTORY: Past Surgical History  Procedure Laterality Date  . Cholecystectomy      FAMILY HISTORY: Reviewed and unchanged. No reported history of chronic disease or malignancy.     ADVANCED DIRECTIVES:    HEALTH MAINTENANCE: History  Substance Use Topics  . Smoking status: Never Smoker   . Smokeless tobacco: Never Used  . Alcohol Use: Yes     Comment: occasional     Colonoscopy:  PAP:  Bone density:  Lipid panel:  No Known Allergies  Current Outpatient Prescriptions  Medication Sig Dispense Refill  . aspirin 81 MG tablet Take 81 mg by mouth daily.    . furosemide (LASIX) 40 MG tablet   5    . levothyroxine (SYNTHROID, LEVOTHROID) 25 MCG tablet   3  . lidocaine-prilocaine (EMLA) cream     . omeprazole (PRILOSEC) 20 MG capsule Take 20 mg by mouth daily.    . prochlorperazine (COMPAZINE) 10 MG tablet   0  . propranolol (INDERAL) 40 MG tablet Take 40 mg by mouth 2 (two) times daily.    . raloxifene (EVISTA) 60 MG tablet Take 60 mg by mouth daily.     No current facility-administered medications for this visit.    OBJECTIVE: Filed Vitals:   05/01/15 1106  BP: 120/71  Pulse: 63  Temp: 97.4 F (36.3 C)  Resp: 20     Body mass index is 19.79 kg/(m^2).    ECOG FS:1 - Symptomatic but completely ambulatory  General: Well-developed, well-nourished, no acute distress. Eyes: anicteric sclera. Lungs: Clear to auscultation bilaterally. Heart: Regular rate and rhythm. No rubs, murmurs, or gallops. Abdomen: Soft, nontender, nondistended. No organomegaly noted, normoactive bowel sounds. Musculoskeletal: No edema, cyanosis, or clubbing. Neuro: Alert, answering all questions appropriately. Cranial nerves grossly intact. Skin: No rashes or petechiae noted. Psych: Normal affect.   LAB RESULTS:  Lab Results  Component Value Date   NA 131* 05/01/2015   K 3.6 05/01/2015   CL 100* 05/01/2015   CO2 26 05/01/2015   GLUCOSE 207* 05/01/2015   BUN 18 05/01/2015   CREATININE 0.58 05/01/2015   CALCIUM 7.8* 05/01/2015   PROT 5.6* 05/01/2015   ALBUMIN 2.9* 05/01/2015   AST 25 05/01/2015  ALT 17 05/01/2015   ALKPHOS 52 05/01/2015   BILITOT 0.6 05/01/2015   GFRNONAA >60 05/01/2015   GFRAA >60 05/01/2015    Lab Results  Component Value Date   WBC 2.3* 05/01/2015   NEUTROABS 1.4 05/01/2015   HGB 9.5* 05/01/2015   HCT 28.7* 05/01/2015   MCV 99.0 05/01/2015   PLT 173 05/01/2015     STUDIES: No results found.  ASSESSMENT: Stage IV pancreatic cancer with liver metastasis.  PLAN:    1. Pancreatic cancer: PET scan results reviewed independently and confirm metastatic disease  in liver. Previously, cytology from ERCP confirmed adenocarcinoma. CA-19-9 is now trending down significantly at 1212 (Started ~12,000). Proceed with cycle 3, day 15 of gemcitabine and Abraxane . Will dose reduce chemotherapy from the onset given her advanced age. Plan to give this treatment on days 1, 8, and 15 with day 22 off. Will reimage at the conclusion of cycle 3. Return to clinic in 4 weeks with repeat PET scan, further evaluation, and consideration of cycle 4, day 1.   2. Hyperbilirubinemia: Bilirubin is now within normal limits after stenting and ERCP. Monitor. 3. Hyperglycemia: Monitor. 4. Neutropenia: Secondary to chemotherapy, monitor. 5. Anemia: Secondary chemotherapy, patient is approximately her baseline. Monitor.  6. Hyponatremia: Mild, likely secondary to chemotherapy. Monitor.  Patient expressed understanding and was in agreement with this plan. She also understands that She can call clinic at any time with any questions, concerns, or complaints.   Pancreatic cancer metastasized to liver   Staging form: Pancreas, AJCC 7th Edition     Clinical stage from 03/29/2015: Stage IV (TX, N0, M1) - Signed by Lloyd Huger, MD on 03/29/2015   Lloyd Huger, MD   05/03/2015 9:34 AM

## 2015-05-27 ENCOUNTER — Ambulatory Visit
Admission: RE | Admit: 2015-05-27 | Discharge: 2015-05-27 | Disposition: A | Discharge status: Home / Self Care | Payer: Medicare Other | Source: Ambulatory Visit | Attending: Oncology | Admitting: Oncology

## 2015-05-27 DIAGNOSIS — J90 Pleural effusion, not elsewhere classified: Secondary | ICD-10-CM | POA: Diagnosis not present

## 2015-05-27 DIAGNOSIS — K869 Disease of pancreas, unspecified: Secondary | ICD-10-CM | POA: Insufficient documentation

## 2015-05-27 DIAGNOSIS — Z9689 Presence of other specified functional implants: Secondary | ICD-10-CM | POA: Insufficient documentation

## 2015-05-27 DIAGNOSIS — C251 Malignant neoplasm of body of pancreas: Secondary | ICD-10-CM | POA: Diagnosis not present

## 2015-05-27 DIAGNOSIS — I2510 Atherosclerotic heart disease of native coronary artery without angina pectoris: Secondary | ICD-10-CM | POA: Diagnosis not present

## 2015-05-27 LAB — GLUCOSE, CAPILLARY: Glucose-Capillary: 125 mg/dL — ABNORMAL HIGH (ref 65–99)

## 2015-05-27 MED ORDER — FLUDEOXYGLUCOSE F - 18 (FDG) INJECTION
12.1500 | Freq: Once | INTRAVENOUS | Status: AC | PRN
  Administered 2015-05-27: 12.15 via INTRAVENOUS

## 2015-05-29 ENCOUNTER — Ambulatory Visit: Payer: Medicare Other | Admitting: Oncology

## 2015-05-29 ENCOUNTER — Inpatient Hospital Stay (HOSPITAL_BASED_OUTPATIENT_CLINIC_OR_DEPARTMENT_OTHER): Payer: Medicare Other | Admitting: Oncology

## 2015-05-29 ENCOUNTER — Ambulatory Visit: Payer: Medicare Other

## 2015-05-29 ENCOUNTER — Inpatient Hospital Stay: Payer: Medicare Other

## 2015-05-29 ENCOUNTER — Other Ambulatory Visit: Payer: Medicare Other

## 2015-05-29 VITALS — BP 148/86 | HR 70 | Temp 97.7°F | Resp 16 | Wt 113.5 lb

## 2015-05-29 DIAGNOSIS — M810 Age-related osteoporosis without current pathological fracture: Secondary | ICD-10-CM

## 2015-05-29 DIAGNOSIS — Z7982 Long term (current) use of aspirin: Secondary | ICD-10-CM

## 2015-05-29 DIAGNOSIS — C250 Malignant neoplasm of head of pancreas: Secondary | ICD-10-CM

## 2015-05-29 DIAGNOSIS — C801 Malignant (primary) neoplasm, unspecified: Secondary | ICD-10-CM

## 2015-05-29 DIAGNOSIS — C251 Malignant neoplasm of body of pancreas: Secondary | ICD-10-CM

## 2015-05-29 DIAGNOSIS — T451X5S Adverse effect of antineoplastic and immunosuppressive drugs, sequela: Secondary | ICD-10-CM

## 2015-05-29 DIAGNOSIS — C787 Secondary malignant neoplasm of liver and intrahepatic bile duct: Secondary | ICD-10-CM | POA: Diagnosis not present

## 2015-05-29 DIAGNOSIS — Z79899 Other long term (current) drug therapy: Secondary | ICD-10-CM | POA: Diagnosis not present

## 2015-05-29 DIAGNOSIS — D6481 Anemia due to antineoplastic chemotherapy: Secondary | ICD-10-CM | POA: Diagnosis not present

## 2015-05-29 DIAGNOSIS — E701 Other hyperphenylalaninemias: Secondary | ICD-10-CM

## 2015-05-29 DIAGNOSIS — K219 Gastro-esophageal reflux disease without esophagitis: Secondary | ICD-10-CM

## 2015-05-29 DIAGNOSIS — E039 Hypothyroidism, unspecified: Secondary | ICD-10-CM

## 2015-05-29 DIAGNOSIS — Z5111 Encounter for antineoplastic chemotherapy: Secondary | ICD-10-CM | POA: Diagnosis not present

## 2015-05-29 DIAGNOSIS — E1165 Type 2 diabetes mellitus with hyperglycemia: Secondary | ICD-10-CM

## 2015-05-29 LAB — COMPREHENSIVE METABOLIC PANEL
ALT: 11 U/L — ABNORMAL LOW (ref 14–54)
AST: 23 U/L (ref 15–41)
Albumin: 3.1 g/dL — ABNORMAL LOW (ref 3.5–5.0)
Alkaline Phosphatase: 59 U/L (ref 38–126)
Anion gap: 5 (ref 5–15)
BUN: 17 mg/dL (ref 6–20)
CO2: 25 mmol/L (ref 22–32)
Calcium: 7.8 mg/dL — ABNORMAL LOW (ref 8.9–10.3)
Chloride: 100 mmol/L — ABNORMAL LOW (ref 101–111)
Creatinine, Ser: 0.65 mg/dL (ref 0.44–1.00)
GFR calc Af Amer: >60 mL/min (ref >60)
GLUCOSE: 243 mg/dL — AB (ref 65–99)
Potassium: 4.2 mmol/L (ref 3.5–5.1)
Sodium: 130 mmol/L — ABNORMAL LOW (ref 135–145)
Total Bilirubin: 0.5 mg/dL (ref 0.3–1.2)
Total Protein: 6.1 g/dL — ABNORMAL LOW (ref 6.5–8.1)

## 2015-05-29 LAB — CBC WITH DIFFERENTIAL/PLATELET
BASOS ABS: 0.1 10*3/uL (ref 0–0.1)
BASOS PCT: 1 %
Eosinophils Absolute: 0.2 10*3/uL (ref 0–0.7)
Eosinophils Relative: 3 %
HCT: 33.6 % — ABNORMAL LOW (ref 35.0–47.0)
Hemoglobin: 10.9 g/dL — ABNORMAL LOW (ref 12.0–16.0)
Lymphocytes Relative: 19 %
Lymphs Abs: 1 10*3/uL (ref 1.0–3.6)
MCH: 32.1 pg (ref 26.0–34.0)
MCHC: 32.4 g/dL (ref 32.0–36.0)
MCV: 99 fL (ref 80.0–100.0)
MONO ABS: 0.7 10*3/uL (ref 0.2–0.9)
Monocytes Relative: 12 %
NEUTROS PCT: 65 %
Neutro Abs: 3.7 10*3/uL (ref 1.4–6.5)
Platelets: 258 10*3/uL (ref 150–440)
RBC: 3.4 MIL/uL — ABNORMAL LOW (ref 3.80–5.20)
RDW: 16.4 % — AB (ref 11.5–14.5)
WBC: 5.6 10*3/uL (ref 3.6–11.0)

## 2015-05-29 MED ORDER — HEPARIN SOD (PORK) LOCK FLUSH 100 UNIT/ML IV SOLN
INTRAVENOUS | Status: AC
  Filled 2015-05-29: qty 5

## 2015-05-29 MED ORDER — HEPARIN SOD (PORK) LOCK FLUSH 100 UNIT/ML IV SOLN
500.0000 [IU] | Freq: Once | INTRAVENOUS | Status: AC
  Administered 2015-05-29: 500 [IU] via INTRAVENOUS

## 2015-05-29 MED ORDER — SODIUM CHLORIDE 0.9 % IJ SOLN
10.0000 mL | INTRAMUSCULAR | Status: DC | PRN
  Administered 2015-05-29: 10 mL via INTRAVENOUS
  Filled 2015-05-29: qty 10

## 2015-05-30 LAB — CANCER ANTIGEN 19-9: CA 19-9: 746 U/mL — ABNORMAL HIGH (ref 0–35)

## 2015-06-06 NOTE — Progress Notes (Signed)
[place]  Telephone:(336) [phone] Fax:(336) [phone]  ID: Amyrah Pinkhasov OB: 09-21-1921  MR#: [id number]  SHF#:[id number]  Patient Care Team: Lloyd Huger, MD as PCP - General (Oncology)  CHIEF COMPLAINT:  Chief Complaint  Patient presents with  . Follow-up    pancreatic cancer    INTERVAL HISTORY: Patient returns to clinic today for further evaluation and discussion of her imaging results. She had increased weakness and fatigue after her last infusion of chemotherapy approximately one month ago but this has since improved. She has no neurologic complaints. She denies any recent fevers or illnesses. She denies any weight loss. She has no chest pain or shortness of breath. She denies any abdominal pain. She denies any nausea, vomiting, constipation, or diarrhea. She has no urinary complaints. Patient offers no specific complaints today.  REVIEW OF SYSTEMS:   Review of Systems  Constitutional: Negative.  Negative for malaise/fatigue.  Eyes: Negative.   Cardiovascular: Positive for leg swelling.  Gastrointestinal: Negative.   Neurological: Negative for weakness.    As per HPI. Otherwise, a complete review of systems is negatve.  PAST MEDICAL HISTORY: Past Medical History  Diagnosis Date  . Hypothyroidism   . Skin cancer   . Pancreatic cancer   . GERD (gastroesophageal reflux disease)   . Diabetes mellitus without complication   . Osteoporosis     PAST SURGICAL HISTORY: Past Surgical History  Procedure Laterality Date  . Cholecystectomy      FAMILY HISTORY: Reviewed and unchanged. No reported history of chronic disease or malignancy.     ADVANCED DIRECTIVES:    HEALTH MAINTENANCE: History  Substance Use Topics  . Smoking status: Never Smoker   . Smokeless tobacco: Never Used  . Alcohol Use: Yes     Comment: occasional     Colonoscopy:  PAP:  Bone density:  Lipid panel:  No Known Allergies  Current Outpatient Prescriptions    Medication Sig Dispense Refill  . ALPRAZolam (XANAX) 0.25 MG tablet   0  . aspirin 81 MG tablet Take 81 mg by mouth daily.    . furosemide (LASIX) 40 MG tablet   5  . levothyroxine (SYNTHROID, LEVOTHROID) 25 MCG tablet   3  . lidocaine-prilocaine (EMLA) cream     . prochlorperazine (COMPAZINE) 10 MG tablet   0  . propranolol (INDERAL) 40 MG tablet Take 40 mg by mouth 2 (two) times daily.    . raloxifene (EVISTA) 60 MG tablet Take 60 mg by mouth daily.     No current facility-administered medications for this visit.    OBJECTIVE: Filed Vitals:   05/29/15 0959  BP: 148/86  Pulse: 70  Temp: 97.7 F (36.5 C)  Resp: 16     Body mass index is 20.76 kg/(m^2).    ECOG FS:1 - Symptomatic but completely ambulatory  General: Well-developed, well-nourished, no acute distress. Eyes: anicteric sclera. Lungs: Clear to auscultation bilaterally. Heart: Regular rate and rhythm. No rubs, murmurs, or gallops. Abdomen: Soft, nontender, nondistended. No organomegaly noted, normoactive bowel sounds. Musculoskeletal: 1-2+ edema. Neuro: Alert, answering all questions appropriately. Cranial nerves grossly intact. Skin: No rashes or petechiae noted. Psych: Normal affect.   LAB RESULTS:  Lab Results  Component Value Date   NA 130* 05/29/2015   K 4.2 05/29/2015   CL 100* 05/29/2015   CO2 25 05/29/2015   GLUCOSE 243* 05/29/2015   BUN 17 05/29/2015   CREATININE 0.65 05/29/2015   CALCIUM 7.8* 05/29/2015   PROT 6.1* 05/29/2015   ALBUMIN  3.1* 05/29/2015   AST 23 05/29/2015   ALT 11* 05/29/2015   ALKPHOS 59 05/29/2015   BILITOT 0.5 05/29/2015   GFRNONAA >60 05/29/2015   GFRAA >60 05/29/2015    Lab Results  Component Value Date   WBC 5.6 05/29/2015   NEUTROABS 3.7 05/29/2015   HGB 10.9* 05/29/2015   HCT 33.6* 05/29/2015   MCV 99.0 05/29/2015   PLT 258 05/29/2015     STUDIES: Nm Pet Image Restag (ps) Skull Base To Thigh  05/27/2015   CLINICAL DATA:  Initial treatment strategy for  pancreatic cancer.  EXAM: NUCLEAR MEDICINE PET SKULL BASE TO THIGH  TECHNIQUE: Twelve point to mCi F-18 FDG was injected intravenously. Full-ring PET imaging was performed from the skull base to thigh after the radiotracer. CT data was obtained and used for attenuation correction and anatomic localization.  FASTING BLOOD GLUCOSE:  Value: 125 mg/dl  COMPARISON:  PET 02/06/2015.  CT abdomen pelvis 02/03/2015.  FINDINGS: NECK  No hypermetabolic lymph nodes in the neck. CT images show no acute findings.  CHEST  Mild hypermetabolism associated with subpleural atelectasis in the right lower lobe. No hypermetabolic mediastinal, hilar or axillary lymph nodes. No hypermetabolic pulmonary nodules. CT images show a right IJ Port-A-Cath terminating in the low SVC. 1.3 cm low-attenuation nodule in the right lobe of the thyroid. Atherosclerotic calcification of the arterial vasculature, including three-vessel involvement of the coronary arteries. Heart is enlarged. No pericardial effusion. Small pleural effusions, right greater than left.  ABDOMEN/PELVIS  Mild hypermetabolism associated with a pancreatic head mass, measuring 2.5 x 3.3 cm with an SUV max of 3.8 (previously 5.2. No residual hypermetabolism within the liver. No abnormal hypermetabolism in the adrenal glands or spleen. No hypermetabolic lymph nodes. CT images show pneumobilia with a common bile duct stent in place. 2.7 cm low-attenuation lesion in the left hepatic lobe is likely a cyst. Cholecystectomy. Adrenal glands and right kidney are unremarkable. Low-attenuation lesions in the left kidney measure up to 6.0 cm and are difficult to definitively characterize without post-contrast imaging. Spleen is unremarkable. Pancreatic head mass is described above with associated atrophy of the body and tail. Stomach and bowel are grossly unremarkable. No free fluid.  SKELETON  No abnormal osseous hypermetabolism. Degenerative changes are seen in the spine.  IMPRESSION: 1.  Mild residual hypermetabolism within a pancreatic head mass. 2. Previously seen hypermetabolic liver lesions are not readily identified. 3. Small bilateral pleural effusions. 4. Pneumobilia with a common bile duct stent in place. 5. Three-vessel coronary artery calcification.   Electronically Signed   By: Lorin Picket M.D.   On: 05/27/2015 12:21    ASSESSMENT: Stage IV pancreatic cancer with liver metastasis.  PLAN:    1. Pancreatic cancer: PET scan results reviewed independently with significant improvement of disease burden. Previously, cytology from ERCP confirmed adenocarcinoma. CA-19-9 has trended down significantly to 746 (Started ~12,000). Will continue to hold treatment at this time, but patient expressed understanding that she will likely need to reinitiate chemotherapy in the near future. We will likely use gemcitabine and Abraxane again. Return to clinic in 4 weeks with repeat laboratory work and further evaluation.  2. Hyperbilirubinemia: Bilirubin is now within normal limits after stenting and ERCP. Monitor. 3. Hyperglycemia: Monitor. 4. Neutropenia: Resolved. 5. Anemia: Secondary chemotherapy, patient is approximately her baseline. Monitor.  6. Hyponatremia: Mild, likely secondary to chemotherapy. Monitor.  Patient expressed understanding and was in agreement with this plan. She also understands that She can call clinic at any time  with any questions, concerns, or complaints.   Pancreatic cancer metastasized to liver   Staging form: Pancreas, AJCC 7th Edition     Clinical stage from 03/29/2015: Stage IV (TX, N0, M1) - Signed by Lloyd Huger, MD on 03/29/2015   Lloyd Huger, MD   06/06/2015 9:06 AM

## 2015-06-26 ENCOUNTER — Inpatient Hospital Stay: Payer: Medicare Other | Attending: Oncology

## 2015-06-26 DIAGNOSIS — C787 Secondary malignant neoplasm of liver and intrahepatic bile duct: Secondary | ICD-10-CM | POA: Insufficient documentation

## 2015-06-26 DIAGNOSIS — C250 Malignant neoplasm of head of pancreas: Secondary | ICD-10-CM

## 2015-06-26 DIAGNOSIS — C259 Malignant neoplasm of pancreas, unspecified: Secondary | ICD-10-CM | POA: Diagnosis not present

## 2015-06-26 LAB — CBC WITH DIFFERENTIAL/PLATELET
BASOS ABS: 0 10*3/uL (ref 0–0.1)
Basophils Relative: 1 %
EOS ABS: 0.1 10*3/uL (ref 0–0.7)
Eosinophils Relative: 2 %
HCT: 35.6 % (ref 35.0–47.0)
HEMOGLOBIN: 11.5 g/dL — AB (ref 12.0–16.0)
LYMPHS ABS: 1.2 10*3/uL (ref 1.0–3.6)
Lymphocytes Relative: 26 %
MCH: 30.8 pg (ref 26.0–34.0)
MCHC: 32.2 g/dL (ref 32.0–36.0)
MCV: 95.6 fL (ref 80.0–100.0)
MONO ABS: 0.6 10*3/uL (ref 0.2–0.9)
Monocytes Relative: 13 %
NEUTROS PCT: 58 %
Neutro Abs: 2.7 10*3/uL (ref 1.4–6.5)
Platelets: 260 10*3/uL (ref 150–440)
RBC: 3.72 MIL/uL — AB (ref 3.80–5.20)
RDW: 15.2 % — AB (ref 11.5–14.5)
WBC: 4.6 10*3/uL (ref 3.6–11.0)

## 2015-06-26 LAB — COMPREHENSIVE METABOLIC PANEL
ALT: 11 U/L — ABNORMAL LOW (ref 14–54)
AST: 22 U/L (ref 15–41)
Albumin: 3.3 g/dL — ABNORMAL LOW (ref 3.5–5.0)
Alkaline Phosphatase: 47 U/L (ref 38–126)
Anion gap: 5 (ref 5–15)
BUN: 19 mg/dL (ref 6–20)
CO2: 28 mmol/L (ref 22–32)
Calcium: 7.9 mg/dL — ABNORMAL LOW (ref 8.9–10.3)
Chloride: 98 mmol/L — ABNORMAL LOW (ref 101–111)
Creatinine, Ser: 0.62 mg/dL (ref 0.44–1.00)
GFR calc non Af Amer: >60 mL/min (ref >60)
Glucose, Bld: 168 mg/dL — ABNORMAL HIGH (ref 65–99)
Potassium: 3.5 mmol/L (ref 3.5–5.1)
Sodium: 131 mmol/L — ABNORMAL LOW (ref 135–145)
Total Bilirubin: 0.6 mg/dL (ref 0.3–1.2)
Total Protein: 6.3 g/dL — ABNORMAL LOW (ref 6.5–8.1)

## 2015-06-27 LAB — CANCER ANTIGEN 19-9: CA 19 9: 1644 U/mL — AB (ref 0–35)

## 2015-07-02 ENCOUNTER — Ambulatory Visit: Payer: Medicare Other | Admitting: Oncology

## 2015-07-03 ENCOUNTER — Other Ambulatory Visit: Payer: Self-pay | Admitting: Oncology

## 2015-07-03 ENCOUNTER — Inpatient Hospital Stay: Payer: Medicare Other | Attending: Oncology | Admitting: Oncology

## 2015-07-03 VITALS — BP 136/83 | HR 65 | Temp 97.8°F | Resp 18 | Wt 106.5 lb

## 2015-07-03 DIAGNOSIS — R531 Weakness: Secondary | ICD-10-CM | POA: Diagnosis not present

## 2015-07-03 DIAGNOSIS — M810 Age-related osteoporosis without current pathological fracture: Secondary | ICD-10-CM | POA: Diagnosis not present

## 2015-07-03 DIAGNOSIS — E039 Hypothyroidism, unspecified: Secondary | ICD-10-CM | POA: Insufficient documentation

## 2015-07-03 DIAGNOSIS — D6481 Anemia due to antineoplastic chemotherapy: Secondary | ICD-10-CM | POA: Diagnosis not present

## 2015-07-03 DIAGNOSIS — R609 Edema, unspecified: Secondary | ICD-10-CM

## 2015-07-03 DIAGNOSIS — Z7982 Long term (current) use of aspirin: Secondary | ICD-10-CM | POA: Diagnosis not present

## 2015-07-03 DIAGNOSIS — C787 Secondary malignant neoplasm of liver and intrahepatic bile duct: Secondary | ICD-10-CM | POA: Diagnosis not present

## 2015-07-03 DIAGNOSIS — Z5111 Encounter for antineoplastic chemotherapy: Secondary | ICD-10-CM | POA: Insufficient documentation

## 2015-07-03 DIAGNOSIS — Z79899 Other long term (current) drug therapy: Secondary | ICD-10-CM | POA: Diagnosis not present

## 2015-07-03 DIAGNOSIS — E119 Type 2 diabetes mellitus without complications: Secondary | ICD-10-CM | POA: Insufficient documentation

## 2015-07-03 DIAGNOSIS — Z85828 Personal history of other malignant neoplasm of skin: Secondary | ICD-10-CM | POA: Diagnosis not present

## 2015-07-03 DIAGNOSIS — R978 Other abnormal tumor markers: Secondary | ICD-10-CM | POA: Diagnosis not present

## 2015-07-03 DIAGNOSIS — K219 Gastro-esophageal reflux disease without esophagitis: Secondary | ICD-10-CM

## 2015-07-03 DIAGNOSIS — C259 Malignant neoplasm of pancreas, unspecified: Secondary | ICD-10-CM | POA: Diagnosis not present

## 2015-07-03 DIAGNOSIS — E871 Hypo-osmolality and hyponatremia: Secondary | ICD-10-CM | POA: Diagnosis not present

## 2015-07-03 DIAGNOSIS — R5383 Other fatigue: Secondary | ICD-10-CM | POA: Insufficient documentation

## 2015-07-03 DIAGNOSIS — T451X5S Adverse effect of antineoplastic and immunosuppressive drugs, sequela: Secondary | ICD-10-CM | POA: Insufficient documentation

## 2015-07-03 DIAGNOSIS — C250 Malignant neoplasm of head of pancreas: Secondary | ICD-10-CM

## 2015-07-08 NOTE — Progress Notes (Signed)
[place]  Telephone:(336) [phone] [phone]) [phone]  ID: Amy Mckenzie OB: July 18, 1921  MR#: [id number]  YCX#:[id number]  Patient Care Team: Lloyd Huger, MD as PCP - General (Oncology)  CHIEF COMPLAINT:  Chief Complaint  Patient presents with  . Follow-up    pancreatic cancer    INTERVAL HISTORY: Patient returns to clinic today for further evaluation and discussion of whether or not to reinitiate treatment. She currently feels well and is asymptomatic. She does not complain of weakness or fatigue today. She has no neurologic complaints. She denies any recent fevers or illnesses. She denies any weight loss. She has no chest pain or shortness of breath. She denies any abdominal pain. She denies any nausea, vomiting, constipation, or diarrhea. She has no urinary complaints. Patient offers no specific complaints today.  REVIEW OF SYSTEMS:   Review of Systems  Constitutional: Negative.  Negative for malaise/fatigue.  Eyes: Negative.   Cardiovascular: Positive for leg swelling.  Gastrointestinal: Negative.   Neurological: Negative for weakness.    As per HPI. Otherwise, a complete review of systems is negatve.  PAST MEDICAL HISTORY: Past Medical History  Diagnosis Date  . Hypothyroidism   . Skin cancer   . Pancreatic cancer   . GERD (gastroesophageal reflux disease)   . Diabetes mellitus without complication   . Osteoporosis     PAST SURGICAL HISTORY: Past Surgical History  Procedure Laterality Date  . Cholecystectomy      FAMILY HISTORY: Reviewed and unchanged. No reported history of chronic disease or malignancy.     ADVANCED DIRECTIVES:    HEALTH MAINTENANCE: History  Substance Use Topics  . Smoking status: Never Smoker   . Smokeless tobacco: Never Used  . Alcohol Use: Yes     Comment: occasional     Colonoscopy:  PAP:  Bone density:  Lipid panel:  No Known Allergies  Current Outpatient Prescriptions  Medication Sig  Dispense Refill  . ALPRAZolam (XANAX) 0.25 MG tablet   0  . aspirin 81 MG tablet Take 81 mg by mouth daily.    . furosemide (LASIX) 40 MG tablet   5  . levothyroxine (SYNTHROID, LEVOTHROID) 25 MCG tablet   3  . lidocaine-prilocaine (EMLA) cream     . prochlorperazine (COMPAZINE) 10 MG tablet   0  . propranolol (INDERAL) 40 MG tablet Take 40 mg by mouth 2 (two) times daily.    . raloxifene (EVISTA) 60 MG tablet Take 60 mg by mouth daily.     No current facility-administered medications for this visit.    OBJECTIVE: Filed Vitals:   07/03/15 1110  BP: 136/83  Pulse: 65  Temp: 97.8 F (36.6 C)  Resp: 18     Body mass index is 19.47 kg/(m^2).    ECOG FS:1 - Symptomatic but completely ambulatory  General: Well-developed, well-nourished, no acute distress. Eyes: anicteric sclera. Lungs: Clear to auscultation bilaterally. Heart: Regular rate and rhythm. No rubs, murmurs, or gallops. Abdomen: Soft, nontender, nondistended. No organomegaly noted, normoactive bowel sounds. Musculoskeletal: 1-2+ edema. Neuro: Alert, answering all questions appropriately. Cranial nerves grossly intact. Skin: No rashes or petechiae noted. Psych: Normal affect.   LAB RESULTS:  Lab Results  Component Value Date   NA 131* 06/26/2015   K 3.5 06/26/2015   CL 98* 06/26/2015   CO2 28 06/26/2015   GLUCOSE 168* 06/26/2015   BUN 19 06/26/2015   CREATININE 0.62 06/26/2015   CALCIUM 7.9* 06/26/2015   PROT 6.3* 06/26/2015   ALBUMIN 3.3* 06/26/2015  AST 22 06/26/2015   ALT 11* 06/26/2015   ALKPHOS 47 06/26/2015   BILITOT 0.6 06/26/2015   GFRNONAA >60 06/26/2015   GFRAA >60 06/26/2015    Lab Results  Component Value Date   WBC 4.6 06/26/2015   NEUTROABS 2.7 06/26/2015   HGB 11.5* 06/26/2015   HCT 35.6 06/26/2015   MCV 95.6 06/26/2015   PLT 260 06/26/2015     STUDIES: No results found.  ASSESSMENT: Stage IV pancreatic cancer with liver metastasis.  PLAN:    1. Pancreatic cancer: PET scan  results previously reviewed independently with significant improvement of disease burden. Previously, cytology from ERCP confirmed adenocarcinoma. CA-19-9 is now trending up and after lengthy discussion, patient has agreed to reinitiate chemotherapy using Temodar and Abraxane. Return to clinic in 1 week to initiate cycle 1, day 1.   2. Hyperbilirubinemia: Bilirubin is now within normal limits after stenting and ERCP. Monitor. 3. Hyperglycemia: Monitor. 4. Neutropenia: Resolved. 5. Anemia: Secondary chemotherapy, patient is approximately her baseline. Monitor.  6. Hyponatremia: Mild, likely secondary to chemotherapy. Monitor.  Patient expressed understanding and was in agreement with this plan. She also understands that She can call clinic at any time with any questions, concerns, or complaints.   Pancreatic cancer metastasized to liver   Staging form: Pancreas, AJCC 7th Edition     Clinical stage from 03/29/2015: Stage IV (TX, N0, M1) - Signed by Lloyd Huger, MD on 03/29/2015   Lloyd Huger, MD   07/08/2015 1:32 PM

## 2015-07-09 ENCOUNTER — Other Ambulatory Visit: Payer: Self-pay | Admitting: *Deleted

## 2015-07-09 DIAGNOSIS — C259 Malignant neoplasm of pancreas, unspecified: Secondary | ICD-10-CM

## 2015-07-09 DIAGNOSIS — C787 Secondary malignant neoplasm of liver and intrahepatic bile duct: Principal | ICD-10-CM

## 2015-07-10 ENCOUNTER — Inpatient Hospital Stay: Payer: Medicare Other

## 2015-07-10 ENCOUNTER — Inpatient Hospital Stay (HOSPITAL_BASED_OUTPATIENT_CLINIC_OR_DEPARTMENT_OTHER): Payer: Medicare Other | Admitting: Oncology

## 2015-07-10 VITALS — BP 139/82 | HR 64 | Temp 96.8°F | Resp 18 | Wt 105.2 lb

## 2015-07-10 DIAGNOSIS — C787 Secondary malignant neoplasm of liver and intrahepatic bile duct: Secondary | ICD-10-CM | POA: Diagnosis not present

## 2015-07-10 DIAGNOSIS — Z5111 Encounter for antineoplastic chemotherapy: Secondary | ICD-10-CM | POA: Diagnosis not present

## 2015-07-10 DIAGNOSIS — T451X5S Adverse effect of antineoplastic and immunosuppressive drugs, sequela: Secondary | ICD-10-CM

## 2015-07-10 DIAGNOSIS — E119 Type 2 diabetes mellitus without complications: Secondary | ICD-10-CM

## 2015-07-10 DIAGNOSIS — E039 Hypothyroidism, unspecified: Secondary | ICD-10-CM

## 2015-07-10 DIAGNOSIS — C259 Malignant neoplasm of pancreas, unspecified: Secondary | ICD-10-CM

## 2015-07-10 DIAGNOSIS — R978 Other abnormal tumor markers: Secondary | ICD-10-CM | POA: Diagnosis not present

## 2015-07-10 DIAGNOSIS — C250 Malignant neoplasm of head of pancreas: Secondary | ICD-10-CM

## 2015-07-10 DIAGNOSIS — K219 Gastro-esophageal reflux disease without esophagitis: Secondary | ICD-10-CM

## 2015-07-10 DIAGNOSIS — M810 Age-related osteoporosis without current pathological fracture: Secondary | ICD-10-CM

## 2015-07-10 DIAGNOSIS — D6481 Anemia due to antineoplastic chemotherapy: Secondary | ICD-10-CM

## 2015-07-10 DIAGNOSIS — R609 Edema, unspecified: Secondary | ICD-10-CM

## 2015-07-10 DIAGNOSIS — Z7982 Long term (current) use of aspirin: Secondary | ICD-10-CM

## 2015-07-10 DIAGNOSIS — E871 Hypo-osmolality and hyponatremia: Secondary | ICD-10-CM

## 2015-07-10 LAB — CBC WITH DIFFERENTIAL/PLATELET
BASOS ABS: 0 10*3/uL (ref 0–0.1)
BASOS PCT: 1 %
EOS ABS: 0.1 10*3/uL (ref 0–0.7)
EOS PCT: 1 %
HCT: 36.3 % (ref 35.0–47.0)
Hemoglobin: 11.9 g/dL — ABNORMAL LOW (ref 12.0–16.0)
LYMPHS PCT: 16 %
Lymphs Abs: 0.8 10*3/uL — ABNORMAL LOW (ref 1.0–3.6)
MCH: 30.4 pg (ref 26.0–34.0)
MCHC: 32.9 g/dL (ref 32.0–36.0)
MCV: 92.6 fL (ref 80.0–100.0)
Monocytes Absolute: 0.5 10*3/uL (ref 0.2–0.9)
Monocytes Relative: 9 %
Neutro Abs: 3.7 10*3/uL (ref 1.4–6.5)
Neutrophils Relative %: 73 %
PLATELETS: 274 10*3/uL (ref 150–440)
RBC: 3.92 MIL/uL (ref 3.80–5.20)
RDW: 14.8 % — ABNORMAL HIGH (ref 11.5–14.5)
WBC: 5.1 10*3/uL (ref 3.6–11.0)

## 2015-07-10 LAB — COMPREHENSIVE METABOLIC PANEL
ALBUMIN: 3.4 g/dL — AB (ref 3.5–5.0)
ALK PHOS: 60 U/L (ref 38–126)
ALT: 11 U/L — AB (ref 14–54)
ANION GAP: 5 (ref 5–15)
AST: 25 U/L (ref 15–41)
BILIRUBIN TOTAL: 0.6 mg/dL (ref 0.3–1.2)
BUN: 18 mg/dL (ref 6–20)
CHLORIDE: 96 mmol/L — AB (ref 101–111)
CO2: 26 mmol/L (ref 22–32)
Calcium: 8.1 mg/dL — ABNORMAL LOW (ref 8.9–10.3)
Creatinine, Ser: 0.66 mg/dL (ref 0.44–1.00)
GFR calc Af Amer: >60 mL/min (ref >60)
GFR calc non Af Amer: >60 mL/min (ref >60)
GLUCOSE: 198 mg/dL — AB (ref 65–99)
POTASSIUM: 3.9 mmol/L (ref 3.5–5.1)
SODIUM: 127 mmol/L — AB (ref 135–145)
Total Protein: 6.8 g/dL (ref 6.5–8.1)

## 2015-07-10 MED ORDER — SODIUM CHLORIDE 0.9 % IV SOLN
Freq: Once | INTRAVENOUS | Status: AC
  Administered 2015-07-10: 11:00:00 via INTRAVENOUS
  Filled 2015-07-10: qty 4

## 2015-07-10 MED ORDER — GEMCITABINE HCL CHEMO INJECTION 1 GM/26.3ML: 800.0000 mg/m2 | Freq: Once | INTRAVENOUS | Status: DC

## 2015-07-10 MED ORDER — SODIUM CHLORIDE 0.9 % IV SOLN
Freq: Once | INTRAVENOUS | Status: AC
  Administered 2015-07-10: 11:00:00 via INTRAVENOUS
  Filled 2015-07-10: qty 1000

## 2015-07-10 MED ORDER — PACLITAXEL PROTEIN-BOUND CHEMO INJECTION 100 MG
100.0000 mg/m2 | Freq: Once | INTRAVENOUS | Status: AC
  Administered 2015-07-10: 150 mg via INTRAVENOUS
  Filled 2015-07-10: qty 30

## 2015-07-10 MED ORDER — SODIUM CHLORIDE 0.9 % IV SOLN
800.0000 mg/m2 | Freq: Once | INTRAVENOUS | Status: AC
  Administered 2015-07-10: 1140 mg via INTRAVENOUS
  Filled 2015-07-10: qty 26.3

## 2015-07-10 MED ORDER — HEPARIN SOD (PORK) LOCK FLUSH 100 UNIT/ML IV SOLN
500.0000 [IU] | Freq: Once | INTRAVENOUS | Status: AC | PRN
  Administered 2015-07-10: 500 [IU]
  Filled 2015-07-10 (×2): qty 5

## 2015-07-10 MED ORDER — GEMCITABINE HCL CHEMO INJECTION 1 GM/26.3ML
1200.0000 mg | Freq: Once | INTRAVENOUS | Status: DC
  Filled 2015-07-10: qty 32

## 2015-07-10 NOTE — Progress Notes (Signed)
[place]  Telephone:([phone] [phone]) [phone]  ID: Halena Mohar OB: 08-18-1921  MR#: [id number]  GGE#:[id number]  Patient Care Team: Lloyd Huger, MD as PCP - General (Oncology)  CHIEF COMPLAINT:  Chief Complaint  Patient presents with  . Follow-up    pancreatic cancer    INTERVAL HISTORY: Patient returns to clinic today for further evaluation and consideration of cycle 1, day 1 of gemcitabine and Abraxane. She currently feels well and is asymptomatic. She does not complain of weakness or fatigue today. She has no neurologic complaints. She denies any recent fevers or illnesses. She denies any weight loss. She has no chest pain or shortness of breath. She denies any abdominal pain. She denies any nausea, vomiting, constipation, or diarrhea. She has no urinary complaints. Patient offers no specific complaints today.  REVIEW OF SYSTEMS:   Review of Systems  Constitutional: Negative.  Negative for malaise/fatigue.  Eyes: Negative.   Cardiovascular: Positive for leg swelling.  Gastrointestinal: Negative.   Neurological: Negative for weakness.    As per HPI. Otherwise, a complete review of systems is negatve.  PAST MEDICAL HISTORY: Past Medical History  Diagnosis Date  . Hypothyroidism   . Skin cancer   . Pancreatic cancer   . GERD (gastroesophageal reflux disease)   . Diabetes mellitus without complication   . Osteoporosis     PAST SURGICAL HISTORY: Past Surgical History  Procedure Laterality Date  . Cholecystectomy      FAMILY HISTORY: Reviewed and unchanged. No reported history of chronic disease or malignancy.     ADVANCED DIRECTIVES:    HEALTH MAINTENANCE: Social History  Substance Use Topics  . Smoking status: Never Smoker   . Smokeless tobacco: Never Used  . Alcohol Use: Yes     Comment: occasional     Colonoscopy:  PAP:  Bone density:  Lipid panel:  No Known Allergies  Current Outpatient Prescriptions    Medication Sig Dispense Refill  . ALPRAZolam (XANAX) 0.25 MG tablet   0  . aspirin 81 MG tablet Take 81 mg by mouth daily.    . furosemide (LASIX) 40 MG tablet   5  . levothyroxine (SYNTHROID, LEVOTHROID) 25 MCG tablet   3  . lidocaine-prilocaine (EMLA) cream     . prochlorperazine (COMPAZINE) 10 MG tablet   0  . propranolol (INDERAL) 40 MG tablet Take 40 mg by mouth 2 (two) times daily.    . raloxifene (EVISTA) 60 MG tablet Take 60 mg by mouth daily.     No current facility-administered medications for this visit.   Facility-Administered Medications Ordered in Other Visits  Medication Dose Route Frequency Provider Last Rate Last Dose  . gemcitabine (GEMZAR) 1,140 mg in sodium chloride 0.9 % 100 mL chemo infusion  800 mg/m2 Intravenous Once Lloyd Huger, MD      . heparin lock flush 100 unit/mL  500 Units Intracatheter Once PRN Lloyd Huger, MD        OBJECTIVE: Filed Vitals:   07/10/15 1008  BP: 139/82  Pulse: 64  Temp: 96.8 F (36 C)  Resp: 18     Body mass index is 19.23 kg/(m^2).    ECOG FS:1 - Symptomatic but completely ambulatory  General: Well-developed, well-nourished, no acute distress. Eyes: anicteric sclera. Lungs: Clear to auscultation bilaterally. Heart: Regular rate and rhythm. No rubs, murmurs, or gallops. Abdomen: Soft, nontender, nondistended. No organomegaly noted, normoactive bowel sounds. Musculoskeletal: 1-2+ edema. Neuro: Alert, answering all questions appropriately. Cranial nerves grossly intact.  Skin: No rashes or petechiae noted. Psych: Normal affect.   LAB RESULTS:  Lab Results  Component Value Date   NA 127* 07/10/2015   K 3.9 07/10/2015   CL 96* 07/10/2015   CO2 26 07/10/2015   GLUCOSE 198* 07/10/2015   BUN 18 07/10/2015   CREATININE 0.66 07/10/2015   CALCIUM 8.1* 07/10/2015   PROT 6.8 07/10/2015   ALBUMIN 3.4* 07/10/2015   AST 25 07/10/2015   ALT 11* 07/10/2015   ALKPHOS 60 07/10/2015   BILITOT 0.6 07/10/2015    GFRNONAA >60 07/10/2015   GFRAA >60 07/10/2015    Lab Results  Component Value Date   WBC 5.1 07/10/2015   NEUTROABS 3.7 07/10/2015   HGB 11.9* 07/10/2015   HCT 36.3 07/10/2015   MCV 92.6 07/10/2015   PLT 274 07/10/2015   Lab Results  Component Value Date   CA199 1644* 06/26/2015    STUDIES: No results found.  ASSESSMENT: Stage IV pancreatic cancer with liver metastasis.  PLAN:    1. Pancreatic cancer: PET scan results previously reviewed independently with significant improvement of disease burden. Previously, cytology from ERCP confirmed adenocarcinoma. CA-19-9 is now trending up. Proceed with cycle 1, day 1 of gemcitabine and Abraxane. Return to clinic in 1 week for consideration of cycle 1, day 8. Plan to give treatment on days 1, 8, and 15 with day 22 off.   2. Hyperbilirubinemia: Bilirubin is now within normal limits after stenting and ERCP. Monitor. 3. Hyperglycemia: Monitor. 4. Neutropenia: Resolved. 5. Anemia: Secondary chemotherapy, patient is approximately her baseline. Monitor.  6. Hyponatremia: Mild, have recommended patient increase her fluid intake.    Patient expressed understanding and was in agreement with this plan. She also understands that She can call clinic at any time with any questions, concerns, or complaints.   Pancreatic cancer metastasized to liver   Staging form: Pancreas, AJCC 7th Edition     Clinical stage from 03/29/2015: Stage IV (TX, N0, M1) - Signed by Lloyd Huger, MD on 03/29/2015   Lloyd Huger, MD   07/10/2015 11:54 AM

## 2015-07-11 LAB — CANCER ANTIGEN 19-9: CA 19-9: 2449 U/mL — ABNORMAL HIGH (ref 0–35)

## 2015-07-17 ENCOUNTER — Inpatient Hospital Stay (HOSPITAL_BASED_OUTPATIENT_CLINIC_OR_DEPARTMENT_OTHER): Payer: Medicare Other | Admitting: Oncology

## 2015-07-17 ENCOUNTER — Inpatient Hospital Stay: Payer: Medicare Other

## 2015-07-17 VITALS — BP 139/80 | HR 60 | Temp 97.1°F | Resp 16 | Wt 107.8 lb

## 2015-07-17 DIAGNOSIS — C259 Malignant neoplasm of pancreas, unspecified: Secondary | ICD-10-CM | POA: Diagnosis not present

## 2015-07-17 DIAGNOSIS — R609 Edema, unspecified: Secondary | ICD-10-CM

## 2015-07-17 DIAGNOSIS — C250 Malignant neoplasm of head of pancreas: Secondary | ICD-10-CM

## 2015-07-17 DIAGNOSIS — R531 Weakness: Secondary | ICD-10-CM

## 2015-07-17 DIAGNOSIS — D6481 Anemia due to antineoplastic chemotherapy: Secondary | ICD-10-CM

## 2015-07-17 DIAGNOSIS — Z5111 Encounter for antineoplastic chemotherapy: Secondary | ICD-10-CM | POA: Diagnosis not present

## 2015-07-17 DIAGNOSIS — E871 Hypo-osmolality and hyponatremia: Secondary | ICD-10-CM

## 2015-07-17 DIAGNOSIS — M810 Age-related osteoporosis without current pathological fracture: Secondary | ICD-10-CM

## 2015-07-17 DIAGNOSIS — R5383 Other fatigue: Secondary | ICD-10-CM

## 2015-07-17 DIAGNOSIS — K219 Gastro-esophageal reflux disease without esophagitis: Secondary | ICD-10-CM

## 2015-07-17 DIAGNOSIS — C787 Secondary malignant neoplasm of liver and intrahepatic bile duct: Principal | ICD-10-CM

## 2015-07-17 DIAGNOSIS — E119 Type 2 diabetes mellitus without complications: Secondary | ICD-10-CM

## 2015-07-17 DIAGNOSIS — E039 Hypothyroidism, unspecified: Secondary | ICD-10-CM

## 2015-07-17 DIAGNOSIS — T451X5S Adverse effect of antineoplastic and immunosuppressive drugs, sequela: Secondary | ICD-10-CM

## 2015-07-17 LAB — CBC WITH DIFFERENTIAL/PLATELET
BASOS ABS: 0 10*3/uL (ref 0–0.1)
BASOS PCT: 1 %
Eosinophils Absolute: 0 10*3/uL (ref 0–0.7)
Eosinophils Relative: 0 %
HEMATOCRIT: 33.3 % — AB (ref 35.0–47.0)
Hemoglobin: 11.1 g/dL — ABNORMAL LOW (ref 12.0–16.0)
LYMPHS PCT: 19 %
Lymphs Abs: 0.7 10*3/uL — ABNORMAL LOW (ref 1.0–3.6)
MCH: 30.6 pg (ref 26.0–34.0)
MCHC: 33.4 g/dL (ref 32.0–36.0)
MCV: 91.7 fL (ref 80.0–100.0)
MONO ABS: 0.3 10*3/uL (ref 0.2–0.9)
Monocytes Relative: 9 %
NEUTROS ABS: 2.6 10*3/uL (ref 1.4–6.5)
NEUTROS PCT: 71 %
PLATELETS: 187 10*3/uL (ref 150–440)
RBC: 3.63 MIL/uL — AB (ref 3.80–5.20)
RDW: 14.4 % (ref 11.5–14.5)
WBC: 3.6 10*3/uL (ref 3.6–11.0)

## 2015-07-17 LAB — COMPREHENSIVE METABOLIC PANEL
ALBUMIN: 2.9 g/dL — AB (ref 3.5–5.0)
ALT: 11 U/L — AB (ref 14–54)
AST: 24 U/L (ref 15–41)
Alkaline Phosphatase: 52 U/L (ref 38–126)
Anion gap: 5 (ref 5–15)
BILIRUBIN TOTAL: 0.3 mg/dL (ref 0.3–1.2)
BUN: 17 mg/dL (ref 6–20)
CHLORIDE: 95 mmol/L — AB (ref 101–111)
CO2: 26 mmol/L (ref 22–32)
CREATININE: 0.67 mg/dL (ref 0.44–1.00)
Calcium: 7.7 mg/dL — ABNORMAL LOW (ref 8.9–10.3)
GFR calc Af Amer: >60 mL/min (ref >60)
GFR calc non Af Amer: >60 mL/min (ref >60)
GLUCOSE: 239 mg/dL — AB (ref 65–99)
POTASSIUM: 4.1 mmol/L (ref 3.5–5.1)
Sodium: 126 mmol/L — ABNORMAL LOW (ref 135–145)
Total Protein: 6.3 g/dL — ABNORMAL LOW (ref 6.5–8.1)

## 2015-07-17 MED ORDER — SODIUM CHLORIDE 0.9 % IJ SOLN
10.0000 mL | INTRAMUSCULAR | Status: DC | PRN
  Filled 2015-07-17: qty 10

## 2015-07-17 MED ORDER — PACLITAXEL PROTEIN-BOUND CHEMO INJECTION 100 MG
100.0000 mg/m2 | Freq: Once | INTRAVENOUS | Status: AC
  Administered 2015-07-17: 150 mg via INTRAVENOUS
  Filled 2015-07-17: qty 30

## 2015-07-17 MED ORDER — SODIUM CHLORIDE 0.9 % IV SOLN
Freq: Once | INTRAVENOUS | Status: AC
  Administered 2015-07-17: 12:00:00 via INTRAVENOUS
  Filled 2015-07-17: qty 4

## 2015-07-17 MED ORDER — SODIUM CHLORIDE 0.9 % IV SOLN
1140.0000 mg | Freq: Once | INTRAVENOUS | Status: AC
  Administered 2015-07-17: 1140 mg via INTRAVENOUS
  Filled 2015-07-17: qty 26.3

## 2015-07-17 MED ORDER — SODIUM CHLORIDE 0.9 % IV SOLN
Freq: Once | INTRAVENOUS | Status: AC
  Administered 2015-07-17: 12:00:00 via INTRAVENOUS
  Filled 2015-07-17: qty 1000

## 2015-07-17 MED ORDER — HEPARIN SOD (PORK) LOCK FLUSH 100 UNIT/ML IV SOLN
500.0000 [IU] | Freq: Once | INTRAVENOUS | Status: AC
  Administered 2015-07-17: 500 [IU] via INTRAVENOUS

## 2015-07-17 MED ORDER — SODIUM CHLORIDE 0.9 % IJ SOLN
10.0000 mL | INTRAMUSCULAR | Status: DC | PRN
  Administered 2015-07-17: 10 mL
  Filled 2015-07-17: qty 10

## 2015-07-17 MED ORDER — HEPARIN SOD (PORK) LOCK FLUSH 100 UNIT/ML IV SOLN
500.0000 [IU] | Freq: Once | INTRAVENOUS | Status: DC | PRN
  Filled 2015-07-17: qty 5

## 2015-07-17 NOTE — Progress Notes (Signed)
Patient's main concern is the increased fatigue that does not improve between treatments.

## 2015-07-19 NOTE — Progress Notes (Signed)
[place]  Telephone:(336) [phone] Fax:(336) [phone]  ID: Kloey Cazarez OB: 1921/05/20  MR#: [id number]  NGE#:[id number]  Patient Care Team: Lloyd Huger, MD as PCP - General (Oncology)  CHIEF COMPLAINT:  Chief Complaint  Patient presents with  . Follow-up    pancreatic cancer    INTERVAL HISTORY: Patient returns to clinic today for further evaluation and consideration of cycle 1, day 8 of gemcitabine and Abraxane. She had increased weakness and fatigue after her first treatment, but otherwise tolerated it well. She has no neurologic complaints. She denies any recent fevers or illnesses. She denies any weight loss. She has no chest pain or shortness of breath. She denies any abdominal pain. She denies any nausea, vomiting, constipation, or diarrhea. She has no urinary complaints. Patient offers no further specific complaints today.  REVIEW OF SYSTEMS:   Review of Systems  Constitutional: Positive for malaise/fatigue.  Eyes: Negative.   Cardiovascular: Positive for leg swelling.  Gastrointestinal: Negative.   Neurological: Positive for weakness.    As per HPI. Otherwise, a complete review of systems is negatve.  PAST MEDICAL HISTORY: Past Medical History  Diagnosis Date  . Hypothyroidism   . Skin cancer   . Pancreatic cancer   . GERD (gastroesophageal reflux disease)   . Diabetes mellitus without complication   . Osteoporosis     PAST SURGICAL HISTORY: Past Surgical History  Procedure Laterality Date  . Cholecystectomy      FAMILY HISTORY: Reviewed and unchanged. No reported history of chronic disease or malignancy.     ADVANCED DIRECTIVES:    HEALTH MAINTENANCE: Social History  Substance Use Topics  . Smoking status: Never Smoker   . Smokeless tobacco: Never Used  . Alcohol Use: Yes     Comment: occasional     Colonoscopy:  PAP:  Bone density:  Lipid panel:  No Known Allergies  Current Outpatient Prescriptions    Medication Sig Dispense Refill  . ALPRAZolam (XANAX) 0.25 MG tablet   0  . aspirin 81 MG tablet Take 81 mg by mouth daily.    . furosemide (LASIX) 40 MG tablet   5  . levothyroxine (SYNTHROID, LEVOTHROID) 25 MCG tablet   3  . lidocaine-prilocaine (EMLA) cream     . prochlorperazine (COMPAZINE) 10 MG tablet   0  . propranolol (INDERAL) 40 MG tablet Take 40 mg by mouth 2 (two) times daily.    . raloxifene (EVISTA) 60 MG tablet Take 60 mg by mouth daily.     No current facility-administered medications for this visit.    OBJECTIVE: Filed Vitals:   07/17/15 1105  BP: 139/80  Pulse: 60  Temp: 97.1 F (36.2 C)  Resp: 16     Body mass index is 19.71 kg/(m^2).    ECOG FS:1 - Symptomatic but completely ambulatory  General: Well-developed, well-nourished, no acute distress. Eyes: anicteric sclera. Lungs: Clear to auscultation bilaterally. Heart: Regular rate and rhythm. No rubs, murmurs, or gallops. Abdomen: Soft, nontender, nondistended. No organomegaly noted, normoactive bowel sounds. Musculoskeletal: 1-2+ edema. Neuro: Alert, answering all questions appropriately. Cranial nerves grossly intact. Skin: No rashes or petechiae noted. Psych: Normal affect.   LAB RESULTS:  Lab Results  Component Value Date   NA 126* 07/17/2015   K 4.1 07/17/2015   CL 95* 07/17/2015   CO2 26 07/17/2015   GLUCOSE 239* 07/17/2015   BUN 17 07/17/2015   CREATININE 0.67 07/17/2015   CALCIUM 7.7* 07/17/2015   PROT 6.3* 07/17/2015   ALBUMIN 2.9*  07/17/2015   AST 24 07/17/2015   ALT 11* 07/17/2015   ALKPHOS 52 07/17/2015   BILITOT 0.3 07/17/2015   GFRNONAA >60 07/17/2015   GFRAA >60 07/17/2015    Lab Results  Component Value Date   WBC 3.6 07/17/2015   NEUTROABS 2.6 07/17/2015   HGB 11.1* 07/17/2015   HCT 33.3* 07/17/2015   MCV 91.7 07/17/2015   PLT 187 07/17/2015   Lab Results  Component Value Date   CA199 2449* 07/10/2015    STUDIES: No results found.  ASSESSMENT: Stage IV  pancreatic cancer with liver metastasis.  PLAN:    1. Pancreatic cancer: PET scan results previously reviewed independently with significant improvement of disease burden. Previously, cytology from ERCP confirmed adenocarcinoma. CA-19-9 is now trending up. Proceed with cycle 1, day 8 of gemcitabine and Abraxane. Return to clinic in 1 week for consideration of cycle 1, day 15. Plan to give treatment on days 1, 8, and 15 with day 22 off.   2. Hyperbilirubinemia: Bilirubin is now within normal limits after stenting and ERCP. Monitor. 3. Hyperglycemia: Monitor. 4. Neutropenia: Resolved. 5. Anemia: Secondary chemotherapy, patient is approximately her baseline. Monitor.  6. Hyponatremia: Mild, have recommended patient increase her fluid intake.    Patient expressed understanding and was in agreement with this plan. She also understands that She can call clinic at any time with any questions, concerns, or complaints.   Pancreatic cancer metastasized to liver   Staging form: Pancreas, AJCC 7th Edition     Clinical stage from 03/29/2015: Stage IV (TX, N0, M1) - Signed by Lloyd Huger, MD on 03/29/2015   Lloyd Huger, MD   07/19/2015 4:13 PM

## 2015-07-24 ENCOUNTER — Inpatient Hospital Stay (HOSPITAL_BASED_OUTPATIENT_CLINIC_OR_DEPARTMENT_OTHER): Payer: Medicare Other | Admitting: Oncology

## 2015-07-24 ENCOUNTER — Inpatient Hospital Stay: Payer: Medicare Other

## 2015-07-24 VITALS — BP 128/83 | HR 60 | Temp 97.1°F | Resp 16 | Wt 108.0 lb

## 2015-07-24 DIAGNOSIS — R5383 Other fatigue: Secondary | ICD-10-CM

## 2015-07-24 DIAGNOSIS — C787 Secondary malignant neoplasm of liver and intrahepatic bile duct: Secondary | ICD-10-CM

## 2015-07-24 DIAGNOSIS — K219 Gastro-esophageal reflux disease without esophagitis: Secondary | ICD-10-CM

## 2015-07-24 DIAGNOSIS — R531 Weakness: Secondary | ICD-10-CM

## 2015-07-24 DIAGNOSIS — D6481 Anemia due to antineoplastic chemotherapy: Secondary | ICD-10-CM

## 2015-07-24 DIAGNOSIS — C259 Malignant neoplasm of pancreas, unspecified: Secondary | ICD-10-CM | POA: Diagnosis not present

## 2015-07-24 DIAGNOSIS — E039 Hypothyroidism, unspecified: Secondary | ICD-10-CM

## 2015-07-24 DIAGNOSIS — E119 Type 2 diabetes mellitus without complications: Secondary | ICD-10-CM

## 2015-07-24 DIAGNOSIS — R978 Other abnormal tumor markers: Secondary | ICD-10-CM | POA: Diagnosis not present

## 2015-07-24 DIAGNOSIS — Z5111 Encounter for antineoplastic chemotherapy: Secondary | ICD-10-CM | POA: Diagnosis not present

## 2015-07-24 DIAGNOSIS — M810 Age-related osteoporosis without current pathological fracture: Secondary | ICD-10-CM

## 2015-07-24 DIAGNOSIS — T451X5S Adverse effect of antineoplastic and immunosuppressive drugs, sequela: Secondary | ICD-10-CM

## 2015-07-24 DIAGNOSIS — Z7982 Long term (current) use of aspirin: Secondary | ICD-10-CM

## 2015-07-24 LAB — COMPREHENSIVE METABOLIC PANEL
ALBUMIN: 3.1 g/dL — AB (ref 3.5–5.0)
ALT: 15 U/L (ref 14–54)
ANION GAP: 7 (ref 5–15)
AST: 29 U/L (ref 15–41)
Alkaline Phosphatase: 58 U/L (ref 38–126)
BUN: 21 mg/dL — AB (ref 6–20)
CHLORIDE: 96 mmol/L — AB (ref 101–111)
CO2: 24 mmol/L (ref 22–32)
Calcium: 7.8 mg/dL — ABNORMAL LOW (ref 8.9–10.3)
Creatinine, Ser: 0.72 mg/dL (ref 0.44–1.00)
GFR calc Af Amer: >60 mL/min (ref >60)
GFR calc non Af Amer: >60 mL/min (ref >60)
GLUCOSE: 210 mg/dL — AB (ref 65–99)
POTASSIUM: 4.2 mmol/L (ref 3.5–5.1)
SODIUM: 127 mmol/L — AB (ref 135–145)
TOTAL PROTEIN: 6.4 g/dL — AB (ref 6.5–8.1)
Total Bilirubin: 0.4 mg/dL (ref 0.3–1.2)

## 2015-07-24 LAB — CBC WITH DIFFERENTIAL/PLATELET
BASOS ABS: 0 10*3/uL (ref 0–0.1)
BASOS PCT: 1 %
EOS ABS: 0 10*3/uL (ref 0–0.7)
EOS PCT: 0 %
HEMATOCRIT: 32.8 % — AB (ref 35.0–47.0)
Hemoglobin: 10.9 g/dL — ABNORMAL LOW (ref 12.0–16.0)
Lymphocytes Relative: 28 %
Lymphs Abs: 0.8 10*3/uL — ABNORMAL LOW (ref 1.0–3.6)
MCH: 30.3 pg (ref 26.0–34.0)
MCHC: 33.2 g/dL (ref 32.0–36.0)
MCV: 91.3 fL (ref 80.0–100.0)
MONO ABS: 0.4 10*3/uL (ref 0.2–0.9)
MONOS PCT: 13 %
NEUTROS ABS: 1.6 10*3/uL (ref 1.4–6.5)
Neutrophils Relative %: 58 %
PLATELETS: 138 10*3/uL — AB (ref 150–440)
RBC: 3.59 MIL/uL — ABNORMAL LOW (ref 3.80–5.20)
RDW: 14.5 % (ref 11.5–14.5)
WBC: 2.8 10*3/uL — ABNORMAL LOW (ref 3.6–11.0)

## 2015-07-24 MED ORDER — SODIUM CHLORIDE 0.9 % IV SOLN
Freq: Once | INTRAVENOUS | Status: AC
  Administered 2015-07-24: 10:00:00 via INTRAVENOUS
  Filled 2015-07-24: qty 4

## 2015-07-24 MED ORDER — SODIUM CHLORIDE 0.9 % IV SOLN
1140.0000 mg | Freq: Once | INTRAVENOUS | Status: AC
  Administered 2015-07-24: 1140 mg via INTRAVENOUS
  Filled 2015-07-24: qty 26.3

## 2015-07-24 MED ORDER — HEPARIN SOD (PORK) LOCK FLUSH 100 UNIT/ML IV SOLN
500.0000 [IU] | Freq: Once | INTRAVENOUS | Status: AC | PRN
  Administered 2015-07-24: 500 [IU]
  Filled 2015-07-24: qty 5

## 2015-07-24 MED ORDER — SODIUM CHLORIDE 0.9 % IV SOLN
Freq: Once | INTRAVENOUS | Status: AC
  Administered 2015-07-24: 10:00:00 via INTRAVENOUS
  Filled 2015-07-24: qty 1000

## 2015-07-24 MED ORDER — SODIUM CHLORIDE 0.9 % IJ SOLN
10.0000 mL | INTRAMUSCULAR | Status: DC | PRN
  Administered 2015-07-24: 10 mL
  Filled 2015-07-24: qty 10

## 2015-07-24 MED ORDER — PACLITAXEL PROTEIN-BOUND CHEMO INJECTION 100 MG
100.0000 mg/m2 | Freq: Once | INTRAVENOUS | Status: AC
Start: 2015-07-24 — End: 2015-07-24
  Administered 2015-07-24: 150 mg via INTRAVENOUS
  Filled 2015-07-24: qty 30

## 2015-07-24 NOTE — Progress Notes (Signed)
Patient's only concern today is feeling very weak.

## 2015-08-01 NOTE — Progress Notes (Signed)
[place]  Telephone:(336) [phone] Fax:(336) [phone]  ID: Amy Mckenzie OB: 1921-10-25  MR#: [id number]  JSH#:[id number]  Patient Care Team: Lloyd Huger, MD as PCP - General (Oncology)  CHIEF COMPLAINT:  Chief Complaint  Patient presents with  . Follow-up    pancreatic cancer    INTERVAL HISTORY: Patient returns to clinic today for further evaluation and consideration of cycle 1, day 15 of gemcitabine and Abraxane. She has worsening weakness and fatigue, but is otherwise tolerating her treatments well. She has no neurologic complaints. She denies any recent fevers or illnesses. She denies any weight loss. She has no chest pain or shortness of breath. She denies any abdominal pain. She denies any nausea, vomiting, constipation, or diarrhea. She has no urinary complaints. Patient offers no further specific complaints today.  REVIEW OF SYSTEMS:   Review of Systems  Constitutional: Positive for malaise/fatigue.  Eyes: Negative.   Cardiovascular: Positive for leg swelling.  Gastrointestinal: Negative.   Neurological: Positive for weakness.    As per HPI. Otherwise, a complete review of systems is negatve.  PAST MEDICAL HISTORY: Past Medical History  Diagnosis Date  . Hypothyroidism   . Skin cancer   . Pancreatic cancer   . GERD (gastroesophageal reflux disease)   . Diabetes mellitus without complication   . Osteoporosis     PAST SURGICAL HISTORY: Past Surgical History  Procedure Laterality Date  . Cholecystectomy      FAMILY HISTORY: Reviewed and unchanged. No reported history of chronic disease or malignancy.     ADVANCED DIRECTIVES:    HEALTH MAINTENANCE: Social History  Substance Use Topics  . Smoking status: Never Smoker   . Smokeless tobacco: Never Used  . Alcohol Use: Yes     Comment: occasional     Colonoscopy:  PAP:  Bone density:  Lipid panel:  No Known Allergies  Current Outpatient Prescriptions  Medication Sig  Dispense Refill  . ALPRAZolam (XANAX) 0.25 MG tablet   0  . aspirin 81 MG tablet Take 81 mg by mouth daily.    . furosemide (LASIX) 40 MG tablet   5  . levothyroxine (SYNTHROID, LEVOTHROID) 25 MCG tablet   3  . lidocaine-prilocaine (EMLA) cream     . prochlorperazine (COMPAZINE) 10 MG tablet   0  . propranolol (INDERAL) 40 MG tablet Take 40 mg by mouth 2 (two) times daily.    . raloxifene (EVISTA) 60 MG tablet Take 60 mg by mouth daily.     No current facility-administered medications for this visit.    OBJECTIVE: Filed Vitals:   07/24/15 0911  BP: 128/83  Pulse: 60  Temp: 97.1 F (36.2 C)  Resp: 16     Body mass index is 19.75 kg/(m^2).    ECOG FS:1 - Symptomatic but completely ambulatory  General: Well-developed, well-nourished, no acute distress. Eyes: anicteric sclera. Lungs: Clear to auscultation bilaterally. Heart: Regular rate and rhythm. No rubs, murmurs, or gallops. Abdomen: Soft, nontender, nondistended. No organomegaly noted, normoactive bowel sounds. Musculoskeletal: 1-2+ edema. Neuro: Alert, answering all questions appropriately. Cranial nerves grossly intact. Skin: No rashes or petechiae noted. Psych: Normal affect.   LAB RESULTS:  Lab Results  Component Value Date   NA 127* 07/24/2015   K 4.2 07/24/2015   CL 96* 07/24/2015   CO2 24 07/24/2015   GLUCOSE 210* 07/24/2015   BUN 21* 07/24/2015   CREATININE 0.72 07/24/2015   CALCIUM 7.8* 07/24/2015   PROT 6.4* 07/24/2015   ALBUMIN 3.1* 07/24/2015  AST 29 07/24/2015   ALT 15 07/24/2015   ALKPHOS 58 07/24/2015   BILITOT 0.4 07/24/2015   GFRNONAA >60 07/24/2015   GFRAA >60 07/24/2015    Lab Results  Component Value Date   WBC 2.8* 07/24/2015   NEUTROABS 1.6 07/24/2015   HGB 10.9* 07/24/2015   HCT 32.8* 07/24/2015   MCV 91.3 07/24/2015   PLT 138* 07/24/2015   Lab Results  Component Value Date   CA199 2449* 07/10/2015    STUDIES: No results found.  ASSESSMENT: Stage IV pancreatic cancer  with liver metastasis.  PLAN:    1. Pancreatic cancer: PET scan results previously reviewed independently with significant improvement of disease burden. Previously, cytology from ERCP confirmed adenocarcinoma. Proceed with cycle 1, day 15 of gemcitabine and Abraxane. Return to clinic in 2 weeks for consideration of cycle 2, day 1. Plan to give treatment on days 1, 8, and 15 with day 22 off.  Will consider reimaging after 2 cycles. 2. Hyperbilirubinemia: Bilirubin is now within normal limits after stenting and ERCP. Monitor. 3. Hyperglycemia: Monitor. 4. Neutropenia: Resolved. 5. Anemia: Secondary chemotherapy, patient is approximately her baseline. Monitor.  6. Hyponatremia: Mild, have recommended patient increase her fluid intake.    Patient expressed understanding and was in agreement with this plan. She also understands that She can call clinic at any time with any questions, concerns, or complaints.   Pancreatic cancer metastasized to liver   Staging form: Pancreas, AJCC 7th Edition     Clinical stage from 03/29/2015: Stage IV (TX, N0, M1) - Signed by Lloyd Huger, MD on 03/29/2015   Lloyd Huger, MD   08/01/2015 9:07 AM

## 2015-08-07 ENCOUNTER — Other Ambulatory Visit: Payer: Medicare Other

## 2015-08-07 ENCOUNTER — Ambulatory Visit: Payer: Medicare Other | Admitting: Oncology

## 2015-08-07 ENCOUNTER — Ambulatory Visit: Payer: Medicare Other

## 2015-08-14 ENCOUNTER — Inpatient Hospital Stay: Payer: Medicare Other

## 2015-08-14 ENCOUNTER — Inpatient Hospital Stay: Payer: Medicare Other | Attending: Oncology

## 2015-08-14 ENCOUNTER — Inpatient Hospital Stay (HOSPITAL_BASED_OUTPATIENT_CLINIC_OR_DEPARTMENT_OTHER): Payer: Medicare Other | Admitting: Oncology

## 2015-08-14 VITALS — BP 119/89 | HR 67 | Temp 96.4°F | Resp 20 | Wt 115.1 lb

## 2015-08-14 DIAGNOSIS — E039 Hypothyroidism, unspecified: Secondary | ICD-10-CM | POA: Insufficient documentation

## 2015-08-14 DIAGNOSIS — Z85828 Personal history of other malignant neoplasm of skin: Secondary | ICD-10-CM | POA: Insufficient documentation

## 2015-08-14 DIAGNOSIS — R600 Localized edema: Secondary | ICD-10-CM

## 2015-08-14 DIAGNOSIS — E1165 Type 2 diabetes mellitus with hyperglycemia: Secondary | ICD-10-CM

## 2015-08-14 DIAGNOSIS — R5383 Other fatigue: Secondary | ICD-10-CM | POA: Diagnosis not present

## 2015-08-14 DIAGNOSIS — Z79899 Other long term (current) drug therapy: Secondary | ICD-10-CM | POA: Diagnosis not present

## 2015-08-14 DIAGNOSIS — R5381 Other malaise: Secondary | ICD-10-CM | POA: Diagnosis not present

## 2015-08-14 DIAGNOSIS — T451X5S Adverse effect of antineoplastic and immunosuppressive drugs, sequela: Secondary | ICD-10-CM | POA: Insufficient documentation

## 2015-08-14 DIAGNOSIS — C259 Malignant neoplasm of pancreas, unspecified: Secondary | ICD-10-CM | POA: Diagnosis not present

## 2015-08-14 DIAGNOSIS — K219 Gastro-esophageal reflux disease without esophagitis: Secondary | ICD-10-CM | POA: Insufficient documentation

## 2015-08-14 DIAGNOSIS — R531 Weakness: Secondary | ICD-10-CM | POA: Insufficient documentation

## 2015-08-14 DIAGNOSIS — Z7982 Long term (current) use of aspirin: Secondary | ICD-10-CM

## 2015-08-14 DIAGNOSIS — C787 Secondary malignant neoplasm of liver and intrahepatic bile duct: Secondary | ICD-10-CM | POA: Insufficient documentation

## 2015-08-14 DIAGNOSIS — D6481 Anemia due to antineoplastic chemotherapy: Secondary | ICD-10-CM | POA: Diagnosis not present

## 2015-08-14 DIAGNOSIS — E871 Hypo-osmolality and hyponatremia: Secondary | ICD-10-CM | POA: Insufficient documentation

## 2015-08-14 DIAGNOSIS — Z5111 Encounter for antineoplastic chemotherapy: Secondary | ICD-10-CM | POA: Diagnosis present

## 2015-08-14 LAB — CBC WITH DIFFERENTIAL/PLATELET
BASOS ABS: 0.1 10*3/uL (ref 0–0.1)
Basophils Relative: 1 %
Eosinophils Absolute: 0.1 10*3/uL (ref 0–0.7)
Eosinophils Relative: 1 %
HEMATOCRIT: 33.5 % — AB (ref 35.0–47.0)
HEMOGLOBIN: 10.9 g/dL — AB (ref 12.0–16.0)
LYMPHS PCT: 13 %
Lymphs Abs: 1 10*3/uL (ref 1.0–3.6)
MCH: 29.8 pg (ref 26.0–34.0)
MCHC: 32.5 g/dL (ref 32.0–36.0)
MCV: 91.7 fL (ref 80.0–100.0)
MONO ABS: 0.8 10*3/uL (ref 0.2–0.9)
Monocytes Relative: 12 %
NEUTROS ABS: 5.3 10*3/uL (ref 1.4–6.5)
Neutrophils Relative %: 73 %
Platelets: 381 10*3/uL (ref 150–440)
RBC: 3.65 MIL/uL — AB (ref 3.80–5.20)
RDW: 16.8 % — ABNORMAL HIGH (ref 11.5–14.5)
WBC: 7.3 10*3/uL (ref 3.6–11.0)

## 2015-08-14 LAB — COMPREHENSIVE METABOLIC PANEL
ALK PHOS: 90 U/L (ref 38–126)
ALT: 12 U/L — AB (ref 14–54)
AST: 28 U/L (ref 15–41)
Albumin: 2.9 g/dL — ABNORMAL LOW (ref 3.5–5.0)
Anion gap: 7 (ref 5–15)
BILIRUBIN TOTAL: 0.6 mg/dL (ref 0.3–1.2)
BUN: 16 mg/dL (ref 6–20)
CO2: 28 mmol/L (ref 22–32)
CREATININE: 0.57 mg/dL (ref 0.44–1.00)
Calcium: 8.3 mg/dL — ABNORMAL LOW (ref 8.9–10.3)
Chloride: 98 mmol/L — ABNORMAL LOW (ref 101–111)
GFR calc Af Amer: >60 mL/min (ref >60)
Glucose, Bld: 233 mg/dL — ABNORMAL HIGH (ref 65–99)
Potassium: 4.1 mmol/L (ref 3.5–5.1)
Sodium: 133 mmol/L — ABNORMAL LOW (ref 135–145)
TOTAL PROTEIN: 6.2 g/dL — AB (ref 6.5–8.1)

## 2015-08-14 MED ORDER — SODIUM CHLORIDE 0.9 % IJ SOLN
10.0000 mL | INTRAMUSCULAR | Status: DC | PRN
  Administered 2015-08-14: 10 mL
  Filled 2015-08-14: qty 10

## 2015-08-14 MED ORDER — SODIUM CHLORIDE 0.9 % IV SOLN
1140.0000 mg | Freq: Once | INTRAVENOUS | Status: AC
  Administered 2015-08-14: 1140 mg via INTRAVENOUS
  Filled 2015-08-14: qty 26.3

## 2015-08-14 MED ORDER — PACLITAXEL PROTEIN-BOUND CHEMO INJECTION 100 MG
100.0000 mg/m2 | Freq: Once | INTRAVENOUS | Status: AC
  Administered 2015-08-14: 150 mg via INTRAVENOUS
  Filled 2015-08-14: qty 30

## 2015-08-14 MED ORDER — SODIUM CHLORIDE 0.9 % IV SOLN
Freq: Once | INTRAVENOUS | Status: AC
  Administered 2015-08-14: 10:00:00 via INTRAVENOUS
  Filled 2015-08-14: qty 1000

## 2015-08-14 MED ORDER — SODIUM CHLORIDE 0.9 % IV SOLN
Freq: Once | INTRAVENOUS | Status: AC
  Administered 2015-08-14: 11:00:00 via INTRAVENOUS
  Filled 2015-08-14: qty 4

## 2015-08-14 MED ORDER — HEPARIN SOD (PORK) LOCK FLUSH 100 UNIT/ML IV SOLN
500.0000 [IU] | Freq: Once | INTRAVENOUS | Status: AC | PRN
  Administered 2015-08-14: 500 [IU]
  Filled 2015-08-14: qty 5

## 2015-08-21 ENCOUNTER — Inpatient Hospital Stay: Payer: Medicare Other

## 2015-08-21 ENCOUNTER — Inpatient Hospital Stay (HOSPITAL_BASED_OUTPATIENT_CLINIC_OR_DEPARTMENT_OTHER): Payer: Medicare Other | Admitting: Oncology

## 2015-08-21 VITALS — BP 151/81 | HR 64 | Temp 97.0°F | Resp 18 | Wt 115.0 lb

## 2015-08-21 DIAGNOSIS — C787 Secondary malignant neoplasm of liver and intrahepatic bile duct: Secondary | ICD-10-CM | POA: Diagnosis not present

## 2015-08-21 DIAGNOSIS — R531 Weakness: Secondary | ICD-10-CM

## 2015-08-21 DIAGNOSIS — Z5111 Encounter for antineoplastic chemotherapy: Secondary | ICD-10-CM | POA: Diagnosis not present

## 2015-08-21 DIAGNOSIS — R5383 Other fatigue: Secondary | ICD-10-CM

## 2015-08-21 DIAGNOSIS — C259 Malignant neoplasm of pancreas, unspecified: Secondary | ICD-10-CM | POA: Diagnosis not present

## 2015-08-21 DIAGNOSIS — K219 Gastro-esophageal reflux disease without esophagitis: Secondary | ICD-10-CM

## 2015-08-21 DIAGNOSIS — E871 Hypo-osmolality and hyponatremia: Secondary | ICD-10-CM

## 2015-08-21 DIAGNOSIS — R5381 Other malaise: Secondary | ICD-10-CM

## 2015-08-21 DIAGNOSIS — E1165 Type 2 diabetes mellitus with hyperglycemia: Secondary | ICD-10-CM

## 2015-08-21 DIAGNOSIS — E039 Hypothyroidism, unspecified: Secondary | ICD-10-CM

## 2015-08-21 DIAGNOSIS — Z85828 Personal history of other malignant neoplasm of skin: Secondary | ICD-10-CM

## 2015-08-21 DIAGNOSIS — R600 Localized edema: Secondary | ICD-10-CM

## 2015-08-21 LAB — COMPREHENSIVE METABOLIC PANEL
ALT: 13 U/L — ABNORMAL LOW (ref 14–54)
AST: 24 U/L (ref 15–41)
Albumin: 2.7 g/dL — ABNORMAL LOW (ref 3.5–5.0)
Alkaline Phosphatase: 75 U/L (ref 38–126)
Anion gap: 6 (ref 5–15)
BUN: 18 mg/dL (ref 6–20)
CO2: 27 mmol/L (ref 22–32)
Calcium: 8.2 mg/dL — ABNORMAL LOW (ref 8.9–10.3)
Chloride: 100 mmol/L — ABNORMAL LOW (ref 101–111)
Creatinine, Ser: 0.61 mg/dL (ref 0.44–1.00)
GFR calc Af Amer: >60 mL/min (ref >60)
GFR calc non Af Amer: >60 mL/min (ref >60)
Glucose, Bld: 187 mg/dL — ABNORMAL HIGH (ref 65–99)
Potassium: 4.1 mmol/L (ref 3.5–5.1)
Sodium: 133 mmol/L — ABNORMAL LOW (ref 135–145)
Total Bilirubin: 0.4 mg/dL (ref 0.3–1.2)
Total Protein: 6 g/dL — ABNORMAL LOW (ref 6.5–8.1)

## 2015-08-21 LAB — CBC WITH DIFFERENTIAL/PLATELET
BASOS ABS: 0 10*3/uL (ref 0–0.1)
BASOS PCT: 1 %
EOS ABS: 0.1 10*3/uL (ref 0–0.7)
EOS PCT: 2 %
HCT: 29.4 % — ABNORMAL LOW (ref 35.0–47.0)
Hemoglobin: 9.7 g/dL — ABNORMAL LOW (ref 12.0–16.0)
LYMPHS PCT: 13 %
Lymphs Abs: 0.5 10*3/uL — ABNORMAL LOW (ref 1.0–3.6)
MCH: 30.1 pg (ref 26.0–34.0)
MCHC: 32.9 g/dL (ref 32.0–36.0)
MCV: 91.3 fL (ref 80.0–100.0)
Monocytes Absolute: 0.4 10*3/uL (ref 0.2–0.9)
Monocytes Relative: 11 %
Neutro Abs: 2.8 10*3/uL (ref 1.4–6.5)
Neutrophils Relative %: 73 %
PLATELETS: 196 10*3/uL (ref 150–440)
RBC: 3.22 MIL/uL — AB (ref 3.80–5.20)
RDW: 16.3 % — AB (ref 11.5–14.5)
WBC: 3.8 10*3/uL (ref 3.6–11.0)

## 2015-08-21 MED ORDER — HEPARIN SOD (PORK) LOCK FLUSH 100 UNIT/ML IV SOLN
500.0000 [IU] | Freq: Once | INTRAVENOUS | Status: AC
  Administered 2015-08-21: 500 [IU] via INTRAVENOUS

## 2015-08-21 MED ORDER — SODIUM CHLORIDE 0.9 % IV SOLN
Freq: Once | INTRAVENOUS | Status: AC
  Administered 2015-08-21: 11:00:00 via INTRAVENOUS
  Filled 2015-08-21: qty 4

## 2015-08-21 MED ORDER — PACLITAXEL PROTEIN-BOUND CHEMO INJECTION 100 MG
100.0000 mg/m2 | Freq: Once | INTRAVENOUS | Status: AC
  Administered 2015-08-21: 150 mg via INTRAVENOUS
  Filled 2015-08-21: qty 30

## 2015-08-21 MED ORDER — SODIUM CHLORIDE 0.9 % IJ SOLN
10.0000 mL | INTRAMUSCULAR | Status: DC | PRN
  Administered 2015-08-21: 10 mL via INTRAVENOUS
  Filled 2015-08-21: qty 10

## 2015-08-21 MED ORDER — SODIUM CHLORIDE 0.9 % IV SOLN
1140.0000 mg | Freq: Once | INTRAVENOUS | Status: AC
  Administered 2015-08-21: 1140 mg via INTRAVENOUS
  Filled 2015-08-21: qty 26.3

## 2015-08-21 MED ORDER — SODIUM CHLORIDE 0.9 % IV SOLN
Freq: Once | INTRAVENOUS | Status: AC
  Administered 2015-08-21: 10:00:00 via INTRAVENOUS
  Filled 2015-08-21: qty 1000

## 2015-08-21 MED ORDER — HEPARIN SOD (PORK) LOCK FLUSH 100 UNIT/ML IV SOLN
INTRAVENOUS | Status: AC
  Filled 2015-08-21: qty 5

## 2015-08-21 NOTE — Progress Notes (Signed)
Patient reports increased swelling to her left leg, states swelling has moved up to her thigh. Patient reports she is not currently taking her Lasix.

## 2015-08-21 NOTE — Progress Notes (Signed)
[place]  Telephone:(336) [phone] Fax:(336) [phone]  ID: Amy Mckenzie OB: 07-Aug-1921  MR#: [id number]  DPO#:[id number]  Patient Care Team: Lloyd Huger, MD as PCP - General (Oncology)  CHIEF COMPLAINT:  Chief Complaint  Patient presents with  . Pancreatic Cancer    follow up    INTERVAL HISTORY: Patient returns to clinic today for further evaluation and consideration of cycle 2, day 1 of gemcitabine and Abraxane. Since having 2 weeks off from her last treatment, her weakness and fatigue has significantly improved. She continues to have lower extremity edema. She has no neurologic complaints. She denies any recent fevers or illnesses. She denies any weight loss. She has no chest pain or shortness of breath. She denies any abdominal pain. She denies any nausea, vomiting, constipation, or diarrhea. She has no urinary complaints. Patient offers no further specific complaints today.  REVIEW OF SYSTEMS:   Review of Systems  Constitutional: Positive for malaise/fatigue.  Eyes: Negative.   Cardiovascular: Positive for leg swelling.  Gastrointestinal: Negative.   Neurological: Positive for weakness.    As per HPI. Otherwise, a complete review of systems is negatve.  PAST MEDICAL HISTORY: Past Medical History  Diagnosis Date  . Hypothyroidism   . Skin cancer   . Pancreatic cancer   . GERD (gastroesophageal reflux disease)   . Diabetes mellitus without complication   . Osteoporosis     PAST SURGICAL HISTORY: Past Surgical History  Procedure Laterality Date  . Cholecystectomy      FAMILY HISTORY: Reviewed and unchanged. No reported history of chronic disease or malignancy.     ADVANCED DIRECTIVES:    HEALTH MAINTENANCE: Social History  Substance Use Topics  . Smoking status: Never Smoker   . Smokeless tobacco: Never Used  . Alcohol Use: Yes     Comment: occasional     Colonoscopy:  PAP:  Bone density:  Lipid panel:  No Known  Allergies  Current Outpatient Prescriptions  Medication Sig Dispense Refill  . ALPRAZolam (XANAX) 0.25 MG tablet   0  . aspirin 81 MG tablet Take 81 mg by mouth daily.    . furosemide (LASIX) 40 MG tablet   5  . levothyroxine (SYNTHROID, LEVOTHROID) 25 MCG tablet   3  . lidocaine-prilocaine (EMLA) cream     . prochlorperazine (COMPAZINE) 10 MG tablet   0  . propranolol (INDERAL) 40 MG tablet Take 40 mg by mouth 2 (two) times daily.    . raloxifene (EVISTA) 60 MG tablet Take 60 mg by mouth daily.     No current facility-administered medications for this visit.   Facility-Administered Medications Ordered in Other Visits  Medication Dose Route Frequency Provider Last Rate Last Dose  . heparin lock flush 100 unit/mL  500 Units Intravenous Once Lloyd Huger, MD      . sodium chloride 0.9 % injection 10 mL  10 mL Intravenous PRN Lloyd Huger, MD        OBJECTIVE: Filed Vitals:   08/14/15 0947  BP: 119/89  Pulse: 67  Temp: 96.4 F (35.8 C)  Resp: 20     Body mass index is 21.04 kg/(m^2).    ECOG FS:1 - Symptomatic but completely ambulatory  General: Well-developed, well-nourished, no acute distress. Eyes: anicteric sclera. Lungs: Clear to auscultation bilaterally. Heart: Regular rate and rhythm. No rubs, murmurs, or gallops. Abdomen: Soft, nontender, nondistended. No organomegaly noted, normoactive bowel sounds. Musculoskeletal: 1-2+ edema. Neuro: Alert, answering all questions appropriately. Cranial nerves grossly intact. Skin:  No rashes or petechiae noted. Psych: Normal affect.   LAB RESULTS:  Lab Results  Component Value Date   NA 133* 08/14/2015   K 4.1 08/14/2015   CL 98* 08/14/2015   CO2 28 08/14/2015   GLUCOSE 233* 08/14/2015   BUN 16 08/14/2015   CREATININE 0.57 08/14/2015   CALCIUM 8.3* 08/14/2015   PROT 6.2* 08/14/2015   ALBUMIN 2.9* 08/14/2015   AST 28 08/14/2015   ALT 12* 08/14/2015   ALKPHOS 90 08/14/2015   BILITOT 0.6 08/14/2015    GFRNONAA >60 08/14/2015   GFRAA >60 08/14/2015    Lab Results  Component Value Date   WBC 3.8 08/21/2015   NEUTROABS 2.8 08/21/2015   HGB 9.7* 08/21/2015   HCT 29.4* 08/21/2015   MCV 91.3 08/21/2015   PLT 196 08/21/2015   Lab Results  Component Value Date   CA199 2449* 07/10/2015    STUDIES: No results found.  ASSESSMENT: Stage IV pancreatic cancer with liver metastasis.  PLAN:    1. Pancreatic cancer: PET scan results previously reviewed independently with significant improvement of disease burden. Previously, cytology from ERCP confirmed adenocarcinoma. Proceed with cycle 2, day 1 of gemcitabine and Abraxane. Return to clinic in 1 week for consideration of cycle 2, day 8. Plan to give treatment on days 1, 8, and 15 with day 22 off.  Will consider reimaging after 2 cycles. 2. Hyperbilirubinemia: Bilirubin is now within normal limits after stenting and ERCP. Monitor. 3. Hyperglycemia: Monitor. Continue current medications. 4. Neutropenia: Resolved. 5. Anemia: Secondary chemotherapy, patient is approximately at her baseline. Monitor.  6. Hyponatremia: Mild, have recommended patient increase her fluid intake. 7. Edema: Continue Lasix as needed with caution given her hyponatremia.   Patient expressed understanding and was in agreement with this plan. She also understands that She can call clinic at any time with any questions, concerns, or complaints.   Pancreatic cancer metastasized to liver   Staging form: Pancreas, AJCC 7th Edition     Clinical stage from 03/29/2015: Stage IV (TX, N0, M1) - Signed by Lloyd Huger, MD on 03/29/2015   Lloyd Huger, MD   08/21/2015 9:58 AM

## 2015-08-22 LAB — CANCER ANTIGEN 19-9: CA 19 9: 2318 U/mL — AB (ref 0–35)

## 2015-08-22 MED ORDER — FUROSEMIDE 40 MG PO TABS: 40.0000 mg | ORAL_TABLET | Freq: Every day | ORAL | Status: DC

## 2015-08-22 NOTE — Progress Notes (Signed)
[place]  Telephone:(336) [phone] [phone]) [phone]  ID: Amy Mckenzie OB: 27-Nov-1921  MR#: [id number]  LOV#:[id number]  Patient Care Team: Lloyd Huger, MD as PCP - General (Oncology)  CHIEF COMPLAINT:  Chief Complaint  Patient presents with  . Pancreatic Cancer    follow up    INTERVAL HISTORY: Patient returns to clinic today for further evaluation and consideration of cycle 2, day 8 of gemcitabine and Abraxane. She is having increased peripheral edema, left greater than right but otherwise feels well. She has no neurologic complaints. She denies any recent fevers or illnesses. She denies any weight loss. She has no chest pain or shortness of breath. She denies any abdominal pain. She denies any nausea, vomiting, constipation, or diarrhea. She has no urinary complaints. Patient offers no further specific complaints today.  REVIEW OF SYSTEMS:   Review of Systems  Constitutional: Positive for malaise/fatigue.  Eyes: Negative.   Cardiovascular: Positive for leg swelling.  Gastrointestinal: Negative.   Neurological: Positive for weakness.    As per HPI. Otherwise, a complete review of systems is negatve.  PAST MEDICAL HISTORY: Past Medical History  Diagnosis Date  . Hypothyroidism   . Skin cancer   . Pancreatic cancer   . GERD (gastroesophageal reflux disease)   . Diabetes mellitus without complication   . Osteoporosis     PAST SURGICAL HISTORY: Past Surgical History  Procedure Laterality Date  . Cholecystectomy      FAMILY HISTORY: Reviewed and unchanged. No reported history of chronic disease or malignancy.     ADVANCED DIRECTIVES:    HEALTH MAINTENANCE: Social History  Substance Use Topics  . Smoking status: Never Smoker   . Smokeless tobacco: Never Used  . Alcohol Use: Yes     Comment: occasional     Colonoscopy:  PAP:  Bone density:  Lipid panel:  No Known Allergies  Current Outpatient Prescriptions  Medication Sig  Dispense Refill  . ALPRAZolam (XANAX) 0.25 MG tablet   0  . aspirin 81 MG tablet Take 81 mg by mouth daily.    . furosemide (LASIX) 40 MG tablet   5  . levothyroxine (SYNTHROID, LEVOTHROID) 25 MCG tablet   3  . lidocaine-prilocaine (EMLA) cream     . prochlorperazine (COMPAZINE) 10 MG tablet   0  . propranolol (INDERAL) 40 MG tablet Take 40 mg by mouth 2 (two) times daily.    . raloxifene (EVISTA) 60 MG tablet Take 60 mg by mouth daily.     No current facility-administered medications for this visit.    OBJECTIVE: Filed Vitals:   08/21/15 0950  BP: 151/81  Pulse: 64  Temp: 97 F (36.1 C)  Resp: 18     Body mass index is 21.03 kg/(m^2).    ECOG FS:1 - Symptomatic but completely ambulatory  General: Well-developed, well-nourished, no acute distress. Eyes: anicteric sclera. Lungs: Clear to auscultation bilaterally. Heart: Regular rate and rhythm. No rubs, murmurs, or gallops. Abdomen: Soft, nontender, nondistended. No organomegaly noted, normoactive bowel sounds. Musculoskeletal: 1-2+ edema on right leg, 2-3+ edema on left leg.  Neuro: Alert, answering all questions appropriately. Cranial nerves grossly intact. Skin: No rashes or petechiae noted. Psych: Normal affect.   LAB RESULTS:  Lab Results  Component Value Date   NA 133* 08/21/2015   K 4.1 08/21/2015   CL 100* 08/21/2015   CO2 27 08/21/2015   GLUCOSE 187* 08/21/2015   BUN 18 08/21/2015   CREATININE 0.61 08/21/2015   CALCIUM 8.2* 08/21/2015  PROT 6.0* 08/21/2015   ALBUMIN 2.7* 08/21/2015   AST 24 08/21/2015   ALT 13* 08/21/2015   ALKPHOS 75 08/21/2015   BILITOT 0.4 08/21/2015   GFRNONAA >60 08/21/2015   GFRAA >60 08/21/2015    Lab Results  Component Value Date   WBC 3.8 08/21/2015   NEUTROABS 2.8 08/21/2015   HGB 9.7* 08/21/2015   HCT 29.4* 08/21/2015   MCV 91.3 08/21/2015   PLT 196 08/21/2015   Lab Results  Component Value Date   CA199 2318* 08/21/2015    STUDIES: No results  found.  ASSESSMENT: Stage IV pancreatic cancer with liver metastasis.  PLAN:    1. Pancreatic cancer: PET scan results previously reviewed independently with significant improvement of disease burden. Previously, cytology from ERCP confirmed adenocarcinoma. Proceed with cycle 2, day 8 of gemcitabine and Abraxane. Return to clinic in 1 week for consideration of cycle 2, day 15. Plan to give treatment on days 1, 8, and 15 with day 22 off.  CA-19-9 is now trending down at 2318.  Will consider reimaging after 2 cycles. 2. Hyperbilirubinemia: Bilirubin is now within normal limits after stenting and ERCP. Monitor. 3. Hyperglycemia: Monitor. Continue current medications. 4. Neutropenia: Resolved. 5. Anemia: Secondary chemotherapy, patient is approximately at her baseline. Monitor.  6. Hyponatremia: Mild, have recommended patient increase her fluid intake. 7. Edema: Patient admits to not taking her Lasix as prescribed. She was given a prescription today and instructed to continue as needed with caution given her hyponatremia.    Patient expressed understanding and was in agreement with this plan. She also understands that She can call clinic at any time with any questions, concerns, or complaints.   Pancreatic cancer metastasized to liver   Staging form: Pancreas, AJCC 7th Edition     Clinical stage from 03/29/2015: Stage IV (TX, N0, M1) - Signed by Lloyd Huger, MD on 03/29/2015   Lloyd Huger, MD   08/22/2015 2:17 PM

## 2015-08-28 ENCOUNTER — Inpatient Hospital Stay: Payer: Medicare Other

## 2015-08-28 ENCOUNTER — Inpatient Hospital Stay (HOSPITAL_BASED_OUTPATIENT_CLINIC_OR_DEPARTMENT_OTHER): Payer: Medicare Other | Admitting: Oncology

## 2015-08-28 VITALS — BP 150/85 | HR 67 | Temp 97.7°F | Resp 16 | Wt 115.7 lb

## 2015-08-28 DIAGNOSIS — E1165 Type 2 diabetes mellitus with hyperglycemia: Secondary | ICD-10-CM

## 2015-08-28 DIAGNOSIS — Z7982 Long term (current) use of aspirin: Secondary | ICD-10-CM

## 2015-08-28 DIAGNOSIS — C259 Malignant neoplasm of pancreas, unspecified: Secondary | ICD-10-CM

## 2015-08-28 DIAGNOSIS — D6481 Anemia due to antineoplastic chemotherapy: Secondary | ICD-10-CM

## 2015-08-28 DIAGNOSIS — R5383 Other fatigue: Secondary | ICD-10-CM

## 2015-08-28 DIAGNOSIS — C787 Secondary malignant neoplasm of liver and intrahepatic bile duct: Secondary | ICD-10-CM | POA: Diagnosis not present

## 2015-08-28 DIAGNOSIS — Z79899 Other long term (current) drug therapy: Secondary | ICD-10-CM

## 2015-08-28 DIAGNOSIS — R600 Localized edema: Secondary | ICD-10-CM

## 2015-08-28 DIAGNOSIS — E871 Hypo-osmolality and hyponatremia: Secondary | ICD-10-CM

## 2015-08-28 DIAGNOSIS — T451X5S Adverse effect of antineoplastic and immunosuppressive drugs, sequela: Secondary | ICD-10-CM

## 2015-08-28 DIAGNOSIS — R531 Weakness: Secondary | ICD-10-CM

## 2015-08-28 DIAGNOSIS — Z5111 Encounter for antineoplastic chemotherapy: Secondary | ICD-10-CM | POA: Diagnosis not present

## 2015-08-28 DIAGNOSIS — R5381 Other malaise: Secondary | ICD-10-CM

## 2015-08-28 LAB — COMPREHENSIVE METABOLIC PANEL
ALBUMIN: 2.7 g/dL — AB (ref 3.5–5.0)
ALT: 17 U/L (ref 14–54)
ANION GAP: 4 — AB (ref 5–15)
AST: 24 U/L (ref 15–41)
Alkaline Phosphatase: 72 U/L (ref 38–126)
BILIRUBIN TOTAL: 0.4 mg/dL (ref 0.3–1.2)
BUN: 17 mg/dL (ref 6–20)
CO2: 29 mmol/L (ref 22–32)
Calcium: 7.4 mg/dL — ABNORMAL LOW (ref 8.9–10.3)
Chloride: 93 mmol/L — ABNORMAL LOW (ref 101–111)
Creatinine, Ser: 0.61 mg/dL (ref 0.44–1.00)
GFR calc non Af Amer: >60 mL/min (ref >60)
GLUCOSE: 226 mg/dL — AB (ref 65–99)
POTASSIUM: 4.1 mmol/L (ref 3.5–5.1)
Sodium: 126 mmol/L — ABNORMAL LOW (ref 135–145)
TOTAL PROTEIN: 6.1 g/dL — AB (ref 6.5–8.1)

## 2015-08-28 LAB — CBC WITH DIFFERENTIAL/PLATELET
BASOS PCT: 1 %
Basophils Absolute: 0 10*3/uL (ref 0–0.1)
EOS ABS: 0 10*3/uL (ref 0–0.7)
Eosinophils Relative: 1 %
HEMATOCRIT: 31.2 % — AB (ref 35.0–47.0)
Hemoglobin: 10.4 g/dL — ABNORMAL LOW (ref 12.0–16.0)
Lymphocytes Relative: 10 %
Lymphs Abs: 0.4 10*3/uL — ABNORMAL LOW (ref 1.0–3.6)
MCH: 30.3 pg (ref 26.0–34.0)
MCHC: 33.3 g/dL (ref 32.0–36.0)
MCV: 90.9 fL (ref 80.0–100.0)
MONO ABS: 0.5 10*3/uL (ref 0.2–0.9)
MONOS PCT: 14 %
NEUTROS ABS: 2.8 10*3/uL (ref 1.4–6.5)
Neutrophils Relative %: 74 %
Platelets: 174 10*3/uL (ref 150–440)
RBC: 3.43 MIL/uL — ABNORMAL LOW (ref 3.80–5.20)
RDW: 16.8 % — AB (ref 11.5–14.5)
WBC: 3.8 10*3/uL (ref 3.6–11.0)

## 2015-08-28 NOTE — Progress Notes (Signed)
Patient says the last treatment has made her feel really weak and this has been the worse she has felt during her treatment.

## 2015-08-29 ENCOUNTER — Inpatient Hospital Stay: Payer: Medicare Other

## 2015-08-29 LAB — CANCER ANTIGEN 19-9
CA 19 9: 1979 U/mL — AB (ref 0–35)
CA 19 9: 1948 U/mL — AB (ref 0–35)

## 2015-08-30 ENCOUNTER — Inpatient Hospital Stay: Payer: Medicare Other

## 2015-08-30 VITALS — BP 121/73 | HR 67 | Temp 96.0°F | Resp 18

## 2015-08-30 DIAGNOSIS — Z5111 Encounter for antineoplastic chemotherapy: Secondary | ICD-10-CM | POA: Diagnosis not present

## 2015-08-30 DIAGNOSIS — C259 Malignant neoplasm of pancreas, unspecified: Secondary | ICD-10-CM

## 2015-08-30 DIAGNOSIS — C787 Secondary malignant neoplasm of liver and intrahepatic bile duct: Principal | ICD-10-CM

## 2015-08-30 MED ORDER — SODIUM CHLORIDE 0.9 % IV SOLN
1140.0000 mg | Freq: Once | INTRAVENOUS | Status: AC
  Administered 2015-08-30: 1140 mg via INTRAVENOUS
  Filled 2015-08-30: qty 5.26

## 2015-08-30 MED ORDER — SODIUM CHLORIDE 0.9 % IJ SOLN
10.0000 mL | INTRAMUSCULAR | Status: DC | PRN
  Administered 2015-08-30: 10 mL
  Filled 2015-08-30: qty 10

## 2015-08-30 MED ORDER — SODIUM CHLORIDE 0.9 % IV SOLN
Freq: Once | INTRAVENOUS | Status: AC
  Administered 2015-08-30: 09:00:00 via INTRAVENOUS
  Filled 2015-08-30: qty 1000

## 2015-08-30 MED ORDER — PACLITAXEL PROTEIN-BOUND CHEMO INJECTION 100 MG
100.0000 mg/m2 | Freq: Once | INTRAVENOUS | Status: AC
  Administered 2015-08-30: 150 mg via INTRAVENOUS
  Filled 2015-08-30: qty 30

## 2015-08-30 MED ORDER — HEPARIN SOD (PORK) LOCK FLUSH 100 UNIT/ML IV SOLN
500.0000 [IU] | Freq: Once | INTRAVENOUS | Status: AC | PRN
  Administered 2015-08-30: 500 [IU]
  Filled 2015-08-30: qty 5

## 2015-08-30 MED ORDER — SODIUM CHLORIDE 0.9 % IV SOLN
Freq: Once | INTRAVENOUS | Status: AC
  Administered 2015-08-30: 10:00:00 via INTRAVENOUS
  Filled 2015-08-30: qty 4

## 2015-09-08 NOTE — Progress Notes (Signed)
[place]  [phone]) [phone] Fax:(336) [phone]  ID: Zetha Kuhar OB: 09-30-1921  MR#: [id number]  SFK#:[id number]  Patient Care Team: Lloyd Huger, MD as PCP - General (Oncology)  CHIEF COMPLAINT:  Chief Complaint  Patient presents with  . Pancreatic Cancer    INTERVAL HISTORY: Patient returns to clinic today for further evaluation and consideration of cycle 2, day 15 of gemcitabine and Abraxane. Her weakness and fatigue are worse over the past week. She also continues to have increased peripheral edema. She has no neurologic complaints. She denies any recent fevers or illnesses. She denies any weight loss. She has no chest pain or shortness of breath. She denies any abdominal pain. She denies any nausea, vomiting, constipation, or diarrhea. She has no urinary complaints. Patient offers no further specific complaints today.  REVIEW OF SYSTEMS:   Review of Systems  Constitutional: Positive for malaise/fatigue. Negative for fever.  Eyes: Negative.   Cardiovascular: Positive for leg swelling.  Gastrointestinal: Negative.   Musculoskeletal: Negative.   Neurological: Negative.     As per HPI. Otherwise, a complete review of systems is negatve.  PAST MEDICAL HISTORY: Past Medical History  Diagnosis Date  . Hypothyroidism   . Skin cancer   . Pancreatic cancer   . GERD (gastroesophageal reflux disease)   . Diabetes mellitus without complication   . Osteoporosis     PAST SURGICAL HISTORY: Past Surgical History  Procedure Laterality Date  . Cholecystectomy      FAMILY HISTORY: Reviewed and unchanged. No reported history of chronic disease or malignancy.     ADVANCED DIRECTIVES:    HEALTH MAINTENANCE: Social History  Substance Use Topics  . Smoking status: Never Smoker   . Smokeless tobacco: Never Used  . Alcohol Use: Yes     Comment: occasional     Colonoscopy:  PAP:  Bone density:  Lipid panel:  No Known Allergies  Current  Outpatient Prescriptions  Medication Sig Dispense Refill  . ALPRAZolam (XANAX) 0.25 MG tablet   0  . aspirin 81 MG tablet Take 81 mg by mouth daily.    . furosemide (LASIX) 40 MG tablet Take 1 tablet (40 mg total) by mouth daily. 30 tablet 5  . levothyroxine (SYNTHROID, LEVOTHROID) 25 MCG tablet   3  . lidocaine-prilocaine (EMLA) cream     . prochlorperazine (COMPAZINE) 10 MG tablet   0  . propranolol (INDERAL) 40 MG tablet Take 40 mg by mouth 2 (two) times daily.    . raloxifene (EVISTA) 60 MG tablet Take 60 mg by mouth daily.     No current facility-administered medications for this visit.    OBJECTIVE: Filed Vitals:   08/28/15 1029  BP: 150/85  Pulse: 67  Temp: 97.7 F (36.5 C)  Resp: 16     Body mass index is 21.16 kg/(m^2).    ECOG FS:1 - Symptomatic but completely ambulatory  General: Well-developed, well-nourished, no acute distress. Eyes: anicteric sclera. Lungs: Clear to auscultation bilaterally. Heart: Regular rate and rhythm. No rubs, murmurs, or gallops. Abdomen: Soft, nontender, nondistended. No organomegaly noted, normoactive bowel sounds. Musculoskeletal: 1-2+ edema on right leg, 2-3+ edema on left leg.  Neuro: Alert, answering all questions appropriately. Cranial nerves grossly intact. Skin: No rashes or petechiae noted. Psych: Normal affect.   LAB RESULTS:  Lab Results  Component Value Date   NA 126* 08/28/2015   K 4.1 08/28/2015   CL 93* 08/28/2015   CO2 29 08/28/2015   GLUCOSE 226* 08/28/2015  BUN 17 08/28/2015   CREATININE 0.61 08/28/2015   CALCIUM 7.4* 08/28/2015   PROT 6.1* 08/28/2015   ALBUMIN 2.7* 08/28/2015   AST 24 08/28/2015   ALT 17 08/28/2015   ALKPHOS 72 08/28/2015   BILITOT 0.4 08/28/2015   GFRNONAA >60 08/28/2015   GFRAA >60 08/28/2015    Lab Results  Component Value Date   WBC 3.8 08/28/2015   NEUTROABS 2.8 08/28/2015   HGB 10.4* 08/28/2015   HCT 31.2* 08/28/2015   MCV 90.9 08/28/2015   PLT 174 08/28/2015   Lab  Results  Component Value Date   CA199 1948* 08/28/2015    STUDIES: No results found.  ASSESSMENT: Stage IV pancreatic cancer with liver metastasis.  PLAN:    1. Pancreatic cancer: PET scan results previously reviewed independently with significant improvement of disease burden. Previously, cytology from ERCP confirmed adenocarcinoma. Proceed with cycle 2, day 15 of gemcitabine and Abraxane.  Plan to give treatment on days 1, 8, and 15 with day 22 off.  CA-19-9 is trending down, but not significantly and is now 1948. Patient wishes some extra time off, therefore will return to clinic in 4 weeks at which time we will repeat PET scan to assess for interval change. 2. Hyperbilirubinemia: Bilirubin is now within normal limits after stenting and ERCP. Monitor. 3. Hyperglycemia: Monitor. Continue current medications. 4. Neutropenia: Resolved. 5. Anemia: Secondary chemotherapy, patient is approximately at her baseline. Monitor.  6. Hyponatremia: Mild, have recommended patient increase her fluid intake. 7. Edema: Continue Lasix as prescribed. She instructed to continue as needed with caution given her hyponatremia.    Patient expressed understanding and was in agreement with this plan. She also understands that She can call clinic at any time with any questions, concerns, or complaints.   Pancreatic cancer metastasized to liver   Staging form: Pancreas, AJCC 7th Edition     Clinical stage from 03/29/2015: Stage IV (TX, N0, M1) - Signed by Lloyd Huger, MD on 03/29/2015   Lloyd Huger, MD   09/08/2015 10:49 AM

## 2015-09-09 ENCOUNTER — Other Ambulatory Visit: Payer: Self-pay | Admitting: Oncology

## 2015-09-11 ENCOUNTER — Ambulatory Visit: Payer: Medicare Other

## 2015-09-11 ENCOUNTER — Ambulatory Visit: Payer: Medicare Other | Admitting: Oncology

## 2015-09-11 ENCOUNTER — Other Ambulatory Visit: Payer: Medicare Other

## 2015-09-20 ENCOUNTER — Telehealth: Payer: Self-pay | Admitting: *Deleted

## 2015-09-20 NOTE — Telephone Encounter (Signed)
I spoke with patients daughter regarding concerns, per Dr. Grayland Ormond patient added on for lab/md next week after her PET scan.

## 2015-09-20 NOTE — Telephone Encounter (Signed)
Patient's daughter, Allegra Lai, would like a call back from Dr. Gary Fleet nurse. Did not specify the reason for the call.

## 2015-09-25 ENCOUNTER — Inpatient Hospital Stay (HOSPITAL_BASED_OUTPATIENT_CLINIC_OR_DEPARTMENT_OTHER): Payer: Medicare Other | Admitting: Oncology

## 2015-09-25 ENCOUNTER — Ambulatory Visit
Admission: RE | Admit: 2015-09-25 | Discharge: 2015-09-25 | Disposition: A | Discharge status: Home / Self Care | Payer: Medicare Other | Source: Ambulatory Visit | Attending: Oncology | Admitting: Oncology

## 2015-09-25 ENCOUNTER — Inpatient Hospital Stay: Payer: Medicare Other | Attending: Oncology

## 2015-09-25 VITALS — BP 150/82 | HR 67 | Temp 95.9°F | Resp 16

## 2015-09-25 DIAGNOSIS — Z79899 Other long term (current) drug therapy: Secondary | ICD-10-CM | POA: Diagnosis not present

## 2015-09-25 DIAGNOSIS — K219 Gastro-esophageal reflux disease without esophagitis: Secondary | ICD-10-CM | POA: Insufficient documentation

## 2015-09-25 DIAGNOSIS — E1165 Type 2 diabetes mellitus with hyperglycemia: Secondary | ICD-10-CM | POA: Insufficient documentation

## 2015-09-25 DIAGNOSIS — E039 Hypothyroidism, unspecified: Secondary | ICD-10-CM

## 2015-09-25 DIAGNOSIS — Z85828 Personal history of other malignant neoplasm of skin: Secondary | ICD-10-CM | POA: Diagnosis not present

## 2015-09-25 DIAGNOSIS — T451X5S Adverse effect of antineoplastic and immunosuppressive drugs, sequela: Secondary | ICD-10-CM | POA: Diagnosis not present

## 2015-09-25 DIAGNOSIS — R5383 Other fatigue: Secondary | ICD-10-CM

## 2015-09-25 DIAGNOSIS — C259 Malignant neoplasm of pancreas, unspecified: Secondary | ICD-10-CM

## 2015-09-25 DIAGNOSIS — D6481 Anemia due to antineoplastic chemotherapy: Secondary | ICD-10-CM | POA: Insufficient documentation

## 2015-09-25 DIAGNOSIS — E871 Hypo-osmolality and hyponatremia: Secondary | ICD-10-CM | POA: Insufficient documentation

## 2015-09-25 DIAGNOSIS — Z08 Encounter for follow-up examination after completed treatment for malignant neoplasm: Secondary | ICD-10-CM | POA: Diagnosis not present

## 2015-09-25 DIAGNOSIS — R600 Localized edema: Secondary | ICD-10-CM

## 2015-09-25 DIAGNOSIS — I2510 Atherosclerotic heart disease of native coronary artery without angina pectoris: Secondary | ICD-10-CM | POA: Insufficient documentation

## 2015-09-25 DIAGNOSIS — M810 Age-related osteoporosis without current pathological fracture: Secondary | ICD-10-CM | POA: Insufficient documentation

## 2015-09-25 DIAGNOSIS — R5381 Other malaise: Secondary | ICD-10-CM

## 2015-09-25 DIAGNOSIS — Z7982 Long term (current) use of aspirin: Secondary | ICD-10-CM | POA: Diagnosis not present

## 2015-09-25 DIAGNOSIS — I700 Atherosclerosis of aorta: Secondary | ICD-10-CM | POA: Insufficient documentation

## 2015-09-25 DIAGNOSIS — C787 Secondary malignant neoplasm of liver and intrahepatic bile duct: Secondary | ICD-10-CM

## 2015-09-25 DIAGNOSIS — R531 Weakness: Secondary | ICD-10-CM

## 2015-09-25 DIAGNOSIS — C250 Malignant neoplasm of head of pancreas: Secondary | ICD-10-CM

## 2015-09-25 LAB — COMPREHENSIVE METABOLIC PANEL
ALBUMIN: 3 g/dL — AB (ref 3.5–5.0)
ALT: 10 U/L — ABNORMAL LOW (ref 14–54)
ANION GAP: 5 (ref 5–15)
AST: 20 U/L (ref 15–41)
Alkaline Phosphatase: 71 U/L (ref 38–126)
BUN: 16 mg/dL (ref 6–20)
CHLORIDE: 96 mmol/L — AB (ref 101–111)
CO2: 28 mmol/L (ref 22–32)
Calcium: 7.6 mg/dL — ABNORMAL LOW (ref 8.9–10.3)
Creatinine, Ser: 0.51 mg/dL (ref 0.44–1.00)
GFR calc non Af Amer: >60 mL/min (ref >60)
GLUCOSE: 129 mg/dL — AB (ref 65–99)
POTASSIUM: 3.9 mmol/L (ref 3.5–5.1)
SODIUM: 129 mmol/L — AB (ref 135–145)
Total Bilirubin: 0.6 mg/dL (ref 0.3–1.2)
Total Protein: 6.8 g/dL (ref 6.5–8.1)

## 2015-09-25 LAB — CBC WITH DIFFERENTIAL/PLATELET
Basophils Absolute: 0.1 10*3/uL (ref 0–0.1)
Basophils Relative: 1 %
Eosinophils Absolute: 0.3 10*3/uL (ref 0–0.7)
Eosinophils Relative: 5 %
HEMATOCRIT: 34.5 % — AB (ref 35.0–47.0)
HEMOGLOBIN: 11.3 g/dL — AB (ref 12.0–16.0)
Lymphs Abs: 0.9 10*3/uL — ABNORMAL LOW (ref 1.0–3.6)
MCH: 29.6 pg (ref 26.0–34.0)
MCHC: 32.8 g/dL (ref 32.0–36.0)
MCV: 90 fL (ref 80.0–100.0)
MONO ABS: 0.8 10*3/uL (ref 0.2–0.9)
NEUTROS ABS: 4.7 10*3/uL (ref 1.4–6.5)
Platelets: 380 10*3/uL (ref 150–440)
RBC: 3.83 MIL/uL (ref 3.80–5.20)
RDW: 17.9 % — AB (ref 11.5–14.5)
WBC: 6.8 10*3/uL (ref 3.6–11.0)

## 2015-09-25 LAB — GLUCOSE, CAPILLARY: GLUCOSE-CAPILLARY: 100 mg/dL — AB (ref 65–99)

## 2015-09-25 MED ORDER — FLUDEOXYGLUCOSE F - 18 (FDG) INJECTION
12.6400 | Freq: Once | INTRAVENOUS | Status: DC | PRN
  Administered 2015-09-25: 12.64 via INTRAVENOUS
  Filled 2015-09-25: qty 12.64

## 2015-09-25 NOTE — Progress Notes (Signed)
Patient here today with concerns of increasing fatigue.

## 2015-09-26 LAB — CANCER ANTIGEN 19-9: CA 19 9: 1605 U/mL — AB (ref 0–35)

## 2015-09-27 ENCOUNTER — Ambulatory Visit: Payer: Medicare Other

## 2015-10-02 ENCOUNTER — Ambulatory Visit: Payer: Medicare Other | Admitting: Oncology

## 2015-10-02 ENCOUNTER — Other Ambulatory Visit: Payer: Medicare Other

## 2015-10-05 NOTE — Progress Notes (Signed)
[place]  [phone]) [phone] Fax:(336) [phone]  ID: Amy Mckenzie OB: [birth year]  MR#: [id number]  AYO#:[id number]  Patient Care Team: Lloyd Huger, MD as PCP - General (Oncology)  CHIEF COMPLAINT:  Chief Complaint  Patient presents with  . Pancreatic Cancer  . Fatigue    INTERVAL HISTORY: Patient returns to clinic today for further evaluation and discussion of her PET scan results. She continues to be persistently weak and fatigued and is somewhat disappointed she did not "bounce back" after her last chemotherapy like she typically does. She also continues to have increased peripheral edema. She has no neurologic complaints. She denies any recent fevers or illnesses. She denies any weight loss. She has no chest pain or shortness of breath. She denies any abdominal pain. She denies any nausea, vomiting, constipation, or diarrhea. She has no urinary complaints. Patient offers no further specific complaints today.  REVIEW OF SYSTEMS:   Review of Systems  Constitutional: Positive for malaise/fatigue. Negative for fever.  Eyes: Negative.   Cardiovascular: Positive for leg swelling.  Gastrointestinal: Negative.   Musculoskeletal: Negative.   Neurological: Positive for weakness.    As per HPI. Otherwise, a complete review of systems is negatve.  PAST MEDICAL HISTORY: Past Medical History  Diagnosis Date  . Hypothyroidism   . Skin cancer   . Pancreatic cancer ([place])   . GERD (gastroesophageal reflux disease)   . Diabetes mellitus without complication ([place])   . Osteoporosis     PAST SURGICAL HISTORY: Past Surgical History  Procedure Laterality Date  . Cholecystectomy      FAMILY HISTORY: Reviewed and unchanged. No reported history of chronic disease or malignancy.     ADVANCED DIRECTIVES:    HEALTH MAINTENANCE: Social History  Substance Use Topics  . Smoking status: Never Smoker   . Smokeless tobacco: Never Used  . Alcohol Use: Yes       Comment: occasional     Colonoscopy:  PAP:  Bone density:  Lipid panel:  No Known Allergies  Current Outpatient Prescriptions  Medication Sig Dispense Refill  . ALPRAZolam (XANAX) 0.25 MG tablet   0  . aspirin 81 MG tablet Take 81 mg by mouth daily.    . furosemide (LASIX) 40 MG tablet Take 1 tablet (40 mg total) by mouth daily. 30 tablet 5  . levothyroxine (SYNTHROID, LEVOTHROID) 25 MCG tablet   3  . lidocaine-prilocaine (EMLA) cream     . prochlorperazine (COMPAZINE) 10 MG tablet   0  . propranolol (INDERAL) 40 MG tablet Take 40 mg by mouth 2 (two) times daily.    . raloxifene (EVISTA) 60 MG tablet Take 60 mg by mouth daily.     No current facility-administered medications for this visit.    OBJECTIVE: Filed Vitals:   09/25/15 1210  BP: 150/82  Pulse: 67  Temp: 95.9 F (35.5 C)  Resp: 16     There is no weight on file to calculate BMI.    ECOG FS:1 - Symptomatic but completely ambulatory  General: Well-developed, well-nourished, no acute distress. Eyes: anicteric sclera. Lungs: Clear to auscultation bilaterally. Heart: Regular rate and rhythm. No rubs, murmurs, or gallops. Abdomen: Soft, nontender, nondistended. No organomegaly noted, normoactive bowel sounds. Musculoskeletal: 1-2+ edema on right leg, 2-3+ edema on left leg.  Neuro: Alert, answering all questions appropriately. Cranial nerves grossly intact. Skin: No rashes or petechiae noted. Psych: Normal affect.   LAB RESULTS:  Lab Results  Component Value Date   NA 129* 09/25/2015  K 3.9 09/25/2015   CL 96* 09/25/2015   CO2 28 09/25/2015   GLUCOSE 129* 09/25/2015   BUN 16 09/25/2015   CREATININE 0.51 09/25/2015   CALCIUM 7.6* 09/25/2015   PROT 6.8 09/25/2015   ALBUMIN 3.0* 09/25/2015   AST 20 09/25/2015   ALT 10* 09/25/2015   ALKPHOS 71 09/25/2015   BILITOT 0.6 09/25/2015   GFRNONAA >60 09/25/2015   GFRAA >60 09/25/2015    Lab Results  Component Value Date   WBC 6.8 09/25/2015    NEUTROABS 4.7 09/25/2015   HGB 11.3* 09/25/2015   HCT 34.5* 09/25/2015   MCV 90.0 09/25/2015   PLT 380 09/25/2015   Lab Results  Component Value Date   CA199 1605* 09/25/2015    STUDIES: Nm Pet Image Restag (ps) Skull Base To Thigh  09/25/2015  CLINICAL DATA:  Subsequent treatment strategy for pancreas cancer. EXAM: NUCLEAR MEDICINE PET SKULL BASE TO THIGH TECHNIQUE: 12.64 mCi F-18 FDG was injected intravenously. Full-ring PET imaging was performed from the skull base to thigh after the radiotracer. CT data was obtained and used for attenuation correction and anatomic localization. FASTING BLOOD GLUCOSE:  Value: 100 mg/dl COMPARISON:  05/27/2015 FINDINGS: NECK No hypermetabolic lymph nodes in the neck. CHEST Mild increase in FDG uptake associated with mediastinal and hilar lymph nodes. The SUV max associated with the right paratracheal lymph node is equal to 3.57. This is compared with 2.77 previously. The SUV max associated with the right hilar lymph node is equal to 3.44. Previously 2.85. No suspicious pulmonary nodules on the CT scan. Aortic atherosclerosis noted. Calcifications are noted within the LAD and left circumflex coronary artery. ABDOMEN/PELVIS No abnormal uptake within the liver, adrenal glands or spleen. Mass within the head of pancreas has an SUV max equal to 4.36. This is mildly increased from 3.81 previously. The common bile duct stent is again noted and appears patent. Increased uptake along the course of the common bile duct stent is noted and appears similar to previous exam. This is nonspecific in may reflect artifact. Tumor progression along the course of the common bile duct stent is not excluded. For reference purposes the SUV max is equal to 5.09. Previous 4.15. SKELETON No focal hypermetabolic activity to suggest skeletal metastasis. IMPRESSION: 1. Stable to mild increase in FDG uptake associated with head of pancreas, mediastinal and hilar lymph nodes. 2. No significant  anatomic change in the appearance of the chest abdomen or pelvis to suggest significant progression of disease. 3. Aortic atherosclerosis as well as coronary artery calcification. 4. Common bile duct stent in place with pneumobilia confirming biliary patency. Electronically Signed   By: Kerby Moors M.D.   On: 09/25/2015 12:21    ASSESSMENT: Stage IV pancreatic cancer with liver metastasis.  PLAN:    1. Pancreatic cancer: PET scan results from September 25, 2015 reviewed independently and essentially revealed stable disease. Her CA-19-9 continues to trend down and is now 1605.  Will delay reinitiating chemotherapy secondary to patient's persistent weakness and fatigue and she will return on October 16, 2015 for consideration of cycle 3, day 1 of gemcitabine and Abraxane. Patient may only be able to tolerate treatments on day 1 and 8 or day 1 and 15 for the remainder of her cycles.  2. Hyperbilirubinemia: Bilirubin is now within normal limits after stenting and ERCP. Monitor. 3. Hyperglycemia: Monitor. Continue current medications. 4. Neutropenia: Resolved. 5. Anemia: Secondary chemotherapy, patient is approximately at her baseline. Monitor.  6. Hyponatremia: Mild, have recommended patient  increase her fluid intake. 7. Edema: Continue Lasix as prescribed. She instructed to continue as needed with caution given her hyponatremia.  8. Weakness and fatigue: Also factorial including chemotherapy, malignancy, and advancing age.  Approximately 30 minutes was spent in discussion and consultation.   Patient expressed understanding and was in agreement with this plan. She also understands that She can call clinic at any time with any questions, concerns, or complaints.   Pancreatic cancer metastasized to liver   Staging form: Pancreas, AJCC 7th Edition     Clinical stage from 03/29/2015: Stage IV (TX, N0, M1) - Signed by Lloyd Huger, MD on 03/29/2015   Lloyd Huger, MD   10/05/2015 8:12  AM

## 2015-10-09 ENCOUNTER — Other Ambulatory Visit: Payer: Medicare Other

## 2015-10-09 ENCOUNTER — Ambulatory Visit: Payer: Medicare Other | Admitting: Oncology

## 2015-10-09 ENCOUNTER — Ambulatory Visit: Payer: Medicare Other

## 2015-10-15 ENCOUNTER — Other Ambulatory Visit: Payer: Self-pay | Admitting: *Deleted

## 2015-10-15 DIAGNOSIS — C787 Secondary malignant neoplasm of liver and intrahepatic bile duct: Principal | ICD-10-CM

## 2015-10-15 DIAGNOSIS — C259 Malignant neoplasm of pancreas, unspecified: Secondary | ICD-10-CM

## 2015-10-16 ENCOUNTER — Inpatient Hospital Stay: Payer: Medicare Other | Attending: Oncology | Admitting: Oncology

## 2015-10-16 ENCOUNTER — Inpatient Hospital Stay: Payer: Medicare Other

## 2015-10-16 VITALS — BP 130/61 | HR 68 | Temp 95.8°F | Resp 16 | Wt 109.1 lb

## 2015-10-16 DIAGNOSIS — Z7982 Long term (current) use of aspirin: Secondary | ICD-10-CM | POA: Diagnosis not present

## 2015-10-16 DIAGNOSIS — K5900 Constipation, unspecified: Secondary | ICD-10-CM | POA: Insufficient documentation

## 2015-10-16 DIAGNOSIS — E531 Pyridoxine deficiency: Secondary | ICD-10-CM

## 2015-10-16 DIAGNOSIS — Z85828 Personal history of other malignant neoplasm of skin: Secondary | ICD-10-CM | POA: Insufficient documentation

## 2015-10-16 DIAGNOSIS — D6481 Anemia due to antineoplastic chemotherapy: Secondary | ICD-10-CM | POA: Insufficient documentation

## 2015-10-16 DIAGNOSIS — M810 Age-related osteoporosis without current pathological fracture: Secondary | ICD-10-CM | POA: Insufficient documentation

## 2015-10-16 DIAGNOSIS — R531 Weakness: Secondary | ICD-10-CM | POA: Insufficient documentation

## 2015-10-16 DIAGNOSIS — C259 Malignant neoplasm of pancreas, unspecified: Secondary | ICD-10-CM | POA: Diagnosis not present

## 2015-10-16 DIAGNOSIS — C787 Secondary malignant neoplasm of liver and intrahepatic bile duct: Secondary | ICD-10-CM

## 2015-10-16 DIAGNOSIS — E871 Hypo-osmolality and hyponatremia: Secondary | ICD-10-CM | POA: Diagnosis not present

## 2015-10-16 DIAGNOSIS — R5383 Other fatigue: Secondary | ICD-10-CM | POA: Diagnosis not present

## 2015-10-16 DIAGNOSIS — R600 Localized edema: Secondary | ICD-10-CM | POA: Diagnosis not present

## 2015-10-16 DIAGNOSIS — Z5111 Encounter for antineoplastic chemotherapy: Secondary | ICD-10-CM | POA: Insufficient documentation

## 2015-10-16 DIAGNOSIS — T451X5S Adverse effect of antineoplastic and immunosuppressive drugs, sequela: Secondary | ICD-10-CM | POA: Diagnosis not present

## 2015-10-16 DIAGNOSIS — E039 Hypothyroidism, unspecified: Secondary | ICD-10-CM | POA: Insufficient documentation

## 2015-10-16 DIAGNOSIS — K219 Gastro-esophageal reflux disease without esophagitis: Secondary | ICD-10-CM | POA: Insufficient documentation

## 2015-10-16 DIAGNOSIS — Z79899 Other long term (current) drug therapy: Secondary | ICD-10-CM | POA: Diagnosis not present

## 2015-10-16 DIAGNOSIS — E1165 Type 2 diabetes mellitus with hyperglycemia: Secondary | ICD-10-CM | POA: Insufficient documentation

## 2015-10-16 LAB — CBC WITH DIFFERENTIAL/PLATELET
BASOS ABS: 0 10*3/uL (ref 0–0.1)
BASOS PCT: 1 %
Eosinophils Absolute: 0.1 10*3/uL (ref 0–0.7)
Eosinophils Relative: 2 %
HEMATOCRIT: 33 % — AB (ref 35.0–47.0)
Hemoglobin: 11 g/dL — ABNORMAL LOW (ref 12.0–16.0)
Lymphocytes Relative: 22 %
Lymphs Abs: 1 10*3/uL (ref 1.0–3.6)
MCH: 30.3 pg (ref 26.0–34.0)
MCHC: 33.3 g/dL (ref 32.0–36.0)
MCV: 90.8 fL (ref 80.0–100.0)
MONO ABS: 0.5 10*3/uL (ref 0.2–0.9)
Monocytes Relative: 11 %
NEUTROS ABS: 2.7 10*3/uL (ref 1.4–6.5)
NEUTROS PCT: 64 %
PLATELETS: 265 10*3/uL (ref 150–440)
RBC: 3.64 MIL/uL — ABNORMAL LOW (ref 3.80–5.20)
RDW: 17 % — AB (ref 11.5–14.5)
WBC: 4.3 10*3/uL (ref 3.6–11.0)

## 2015-10-16 LAB — COMPREHENSIVE METABOLIC PANEL
ALT: 10 U/L — AB (ref 14–54)
AST: 21 U/L (ref 15–41)
Albumin: 3.2 g/dL — ABNORMAL LOW (ref 3.5–5.0)
Alkaline Phosphatase: 57 U/L (ref 38–126)
Anion gap: 7 (ref 5–15)
BUN: 13 mg/dL (ref 6–20)
CHLORIDE: 90 mmol/L — AB (ref 101–111)
CO2: 26 mmol/L (ref 22–32)
CREATININE: 0.54 mg/dL (ref 0.44–1.00)
Calcium: 8.2 mg/dL — ABNORMAL LOW (ref 8.9–10.3)
Glucose, Bld: 237 mg/dL — ABNORMAL HIGH (ref 65–99)
POTASSIUM: 4.2 mmol/L (ref 3.5–5.1)
Sodium: 123 mmol/L — ABNORMAL LOW (ref 135–145)
Total Bilirubin: 0.6 mg/dL (ref 0.3–1.2)
Total Protein: 6.4 g/dL — ABNORMAL LOW (ref 6.5–8.1)

## 2015-10-16 MED ORDER — PACLITAXEL PROTEIN-BOUND CHEMO INJECTION 100 MG
100.0000 mg/m2 | Freq: Once | INTRAVENOUS | Status: AC
  Administered 2015-10-16: 150 mg via INTRAVENOUS
  Filled 2015-10-16: qty 30

## 2015-10-16 MED ORDER — SODIUM CHLORIDE 0.9 % IV SOLN
Freq: Once | INTRAVENOUS | Status: AC
  Administered 2015-10-16: 11:00:00 via INTRAVENOUS
  Filled 2015-10-16: qty 4

## 2015-10-16 MED ORDER — HEPARIN SOD (PORK) LOCK FLUSH 100 UNIT/ML IV SOLN
500.0000 [IU] | Freq: Once | INTRAVENOUS | Status: DC | PRN
  Filled 2015-10-16: qty 5

## 2015-10-16 MED ORDER — SODIUM CHLORIDE 0.9 % IV SOLN
Freq: Once | INTRAVENOUS | Status: AC
Start: 2015-10-16 — End: 2015-10-16
  Administered 2015-10-16: 11:00:00 via INTRAVENOUS
  Filled 2015-10-16: qty 1000

## 2015-10-16 MED ORDER — SODIUM CHLORIDE 0.9 % IJ SOLN
10.0000 mL | INTRAMUSCULAR | Status: DC | PRN
  Filled 2015-10-16: qty 10

## 2015-10-16 MED ORDER — HEPARIN SOD (PORK) LOCK FLUSH 100 UNIT/ML IV SOLN
500.0000 [IU] | Freq: Once | INTRAVENOUS | Status: AC
  Administered 2015-10-16: 500 [IU] via INTRAVENOUS

## 2015-10-16 MED ORDER — GEMCITABINE HCL CHEMO INJECTION 1 GM/26.3ML
1140.0000 mg | Freq: Once | INTRAVENOUS | Status: AC
  Administered 2015-10-16: 1140 mg via INTRAVENOUS
  Filled 2015-10-16: qty 26.3

## 2015-10-16 NOTE — Progress Notes (Signed)
[place]  [phone]) [phone] Fax:(336) [phone]  ID: Nakya Yonko OB: [birth year]  MR#: [id number]  [id number]  Patient Care Team: Lloyd Huger, MD as PCP - General (Oncology)  CHIEF COMPLAINT:  Chief Complaint  Patient presents with  . Pancreatic Cancer    INTERVAL HISTORY: Patient returns to clinic today for further evaluation and to reinitiate treatment.  Her weakness and fatigued have improved. She continues to have increased peripheral edema. She has no neurologic complaints. She denies any recent fevers or illnesses. She denies any weight loss. She has no chest pain or shortness of breath. She denies any abdominal pain. She denies any nausea, vomiting, or diarrhea. She does have occasional constipation. She has no urinary complaints. Patient offers no further specific complaints today.  REVIEW OF SYSTEMS:   Review of Systems  Constitutional: Positive for malaise/fatigue. Negative for fever.  Eyes: Negative.   Respiratory: Negative.   Cardiovascular: Positive for leg swelling.  Gastrointestinal: Positive for constipation. Negative for nausea, vomiting, abdominal pain and diarrhea.  Musculoskeletal: Negative.   Neurological: Negative.     As per HPI. Otherwise, a complete review of systems is negatve.  PAST MEDICAL HISTORY: Past Medical History  Diagnosis Date  . Hypothyroidism   . Skin cancer   . Pancreatic cancer ([place])   . GERD (gastroesophageal reflux disease)   . Diabetes mellitus without complication ([place])   . Osteoporosis     PAST SURGICAL HISTORY: Past Surgical History  Procedure Laterality Date  . Cholecystectomy      FAMILY HISTORY: Reviewed and unchanged. No reported history of chronic disease or malignancy.     ADVANCED DIRECTIVES:    HEALTH MAINTENANCE: Social History  Substance Use Topics  . Smoking status: Never Smoker   . Smokeless tobacco: Never Used  . Alcohol Use: Yes     Comment: occasional      Colonoscopy:  PAP:  Bone density:  Lipid panel:  No Known Allergies  Current Outpatient Prescriptions  Medication Sig Dispense Refill  . ALPRAZolam (XANAX) 0.25 MG tablet   0  . aspirin 81 MG tablet Take 81 mg by mouth daily.    . furosemide (LASIX) 40 MG tablet Take 1 tablet (40 mg total) by mouth daily. 30 tablet 5  . levothyroxine (SYNTHROID, LEVOTHROID) 25 MCG tablet   3  . lidocaine-prilocaine (EMLA) cream     . prochlorperazine (COMPAZINE) 10 MG tablet   0  . propranolol (INDERAL) 40 MG tablet Take 40 mg by mouth 2 (two) times daily.    . raloxifene (EVISTA) 60 MG tablet Take 60 mg by mouth daily.     No current facility-administered medications for this visit.    OBJECTIVE: Filed Vitals:   10/16/15 1005  BP: 130/61  Pulse: 68  Temp: 95.8 F (35.4 C)  Resp: 16     Body mass index is 19.95 kg/(m^2).    ECOG FS:2 - Symptomatic, <50% confined to bed  General: Well-developed, well-nourished, no acute distress. Eyes: anicteric sclera. Lungs: Clear to auscultation bilaterally. Heart: Regular rate and rhythm. No rubs, murmurs, or gallops. Abdomen: Soft, nontender, nondistended. No organomegaly noted, normoactive bowel sounds. Musculoskeletal: 1-2+ edema on right leg, 2-3+ edema on left leg.  Neuro: Alert, answering all questions appropriately. Cranial nerves grossly intact. Skin: No rashes or petechiae noted. Psych: Normal affect.   LAB RESULTS:  Lab Results  Component Value Date   NA 123* 10/16/2015   K 4.2 10/16/2015   CL 90* 10/16/2015   CO2  26 10/16/2015   GLUCOSE 237* 10/16/2015   BUN 13 10/16/2015   CREATININE 0.54 10/16/2015   CALCIUM 8.2* 10/16/2015   PROT 6.4* 10/16/2015   ALBUMIN 3.2* 10/16/2015   AST 21 10/16/2015   ALT 10* 10/16/2015   ALKPHOS 57 10/16/2015   BILITOT 0.6 10/16/2015   GFRNONAA >60 10/16/2015   GFRAA >60 10/16/2015    Lab Results  Component Value Date   WBC 4.3 10/16/2015   NEUTROABS 2.7 10/16/2015   HGB 11.0*  10/16/2015   HCT 33.0* 10/16/2015   MCV 90.8 10/16/2015   PLT 265 10/16/2015   Lab Results  Component Value Date   CA199 1605* 09/25/2015    STUDIES: Nm Pet Image Restag (ps) Skull Base To Thigh  09/25/2015  CLINICAL DATA:  Subsequent treatment strategy for pancreas cancer. EXAM: NUCLEAR MEDICINE PET SKULL BASE TO THIGH TECHNIQUE: 12.64 mCi F-18 FDG was injected intravenously. Full-ring PET imaging was performed from the skull base to thigh after the radiotracer. CT data was obtained and used for attenuation correction and anatomic localization. FASTING BLOOD GLUCOSE:  Value: 100 mg/dl COMPARISON:  05/27/2015 FINDINGS: NECK No hypermetabolic lymph nodes in the neck. CHEST Mild increase in FDG uptake associated with mediastinal and hilar lymph nodes. The SUV max associated with the right paratracheal lymph node is equal to 3.57. This is compared with 2.77 previously. The SUV max associated with the right hilar lymph node is equal to 3.44. Previously 2.85. No suspicious pulmonary nodules on the CT scan. Aortic atherosclerosis noted. Calcifications are noted within the LAD and left circumflex coronary artery. ABDOMEN/PELVIS No abnormal uptake within the liver, adrenal glands or spleen. Mass within the head of pancreas has an SUV max equal to 4.36. This is mildly increased from 3.81 previously. The common bile duct stent is again noted and appears patent. Increased uptake along the course of the common bile duct stent is noted and appears similar to previous exam. This is nonspecific in may reflect artifact. Tumor progression along the course of the common bile duct stent is not excluded. For reference purposes the SUV max is equal to 5.09. Previous 4.15. SKELETON No focal hypermetabolic activity to suggest skeletal metastasis. IMPRESSION: 1. Stable to mild increase in FDG uptake associated with head of pancreas, mediastinal and hilar lymph nodes. 2. No significant anatomic change in the appearance of the  chest abdomen or pelvis to suggest significant progression of disease. 3. Aortic atherosclerosis as well as coronary artery calcification. 4. Common bile duct stent in place with pneumobilia confirming biliary patency. Electronically Signed   By: Kerby Moors M.D.   On: 09/25/2015 12:21    ASSESSMENT: Stage IV pancreatic cancer with liver metastasis.  PLAN:    1. Pancreatic cancer: PET scan results from September 25, 2015 reviewed independently and essentially revealed stable disease. Her CA-19-9 continues to trend down and is now 1605, today's result is pending.  Proceed with cycle 3, day 1 of gemcitabine and Abraxane. Patient may only be able to tolerate treatments on day 1 and 15 for the remainder of her cycles. Return to clinic in 2 weeks for consideration of cycle 3, day 15. 2. Hyperbilirubinemia: Bilirubin is now within normal limits after stenting and ERCP. Monitor. 3. Hyperglycemia: Monitor. Continue current medications. 4. Neutropenia: Resolved. 5. Anemia: Secondary chemotherapy, patient is approximately at her baseline. Monitor.  6. Hyponatremia: Mild, have recommended patient decrease her fluid intake. 7. Edema: Continue Lasix as prescribed. She instructed to continue as needed with caution given her  hyponatremia.  8. Weakness and fatigue: Also factorial including chemotherapy, malignancy, and advancing age. 9. Constipation: Continue OTC miralax as needed.   Patient expressed understanding and was in agreement with this plan. She also understands that She can call clinic at any time with any questions, concerns, or complaints.   Pancreatic cancer metastasized to liver   Staging form: Pancreas, AJCC 7th Edition     Clinical stage from 03/29/2015: Stage IV ([place], N0, M1) - Signed by Lloyd Huger, MD on 03/29/2015   Lloyd Huger, MD   10/16/2015 10:42 PM

## 2015-10-16 NOTE — Progress Notes (Signed)
Patient is having constipation with last bowel movement 4-5 days ago.  She has taken 4 doses of Miralax within the last 24 hours with no relief so she is going to try Dulcolax.

## 2015-10-17 LAB — CANCER ANTIGEN 19-9: CA 19-9: 2728 U/mL — ABNORMAL HIGH (ref 0–35)

## 2015-10-21 IMAGING — CT NM PET TUM IMG RESTAG (PS) SKULL BASE T - THIGH
11 series · 23 of 25 positions shown · non-contrast
Comparison: PET 02/06/2015.  CT abdomen pelvis 02/03/2015.

CLINICAL DATA: Initial treatment strategy for pancreatic cancer.

EXAM:
NUCLEAR MEDICINE PET SKULL BASE TO THIGH
TECHNIQUE: Twelve point to mCi F-18 FDG was injected intravenously. Full-ring
PET imaging was performed from the skull base to thigh after the
radiotracer. CT data was obtained and used for attenuation
correction and anatomic localization.
FASTING BLOOD GLUCOSE:  Value: 125 mg/dl

[Series 3: ct wb 5.0 b30f · axial · 5.0mm · 0.98mm/px · z∈[-1310,-560]mm · 3 of 251 slices shown]
[im 1/251]
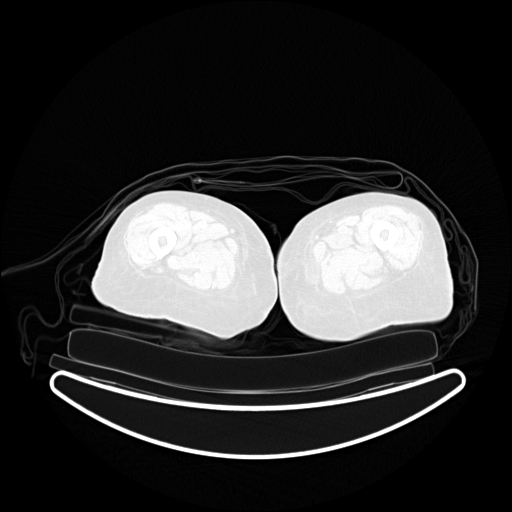
[im 126/251]
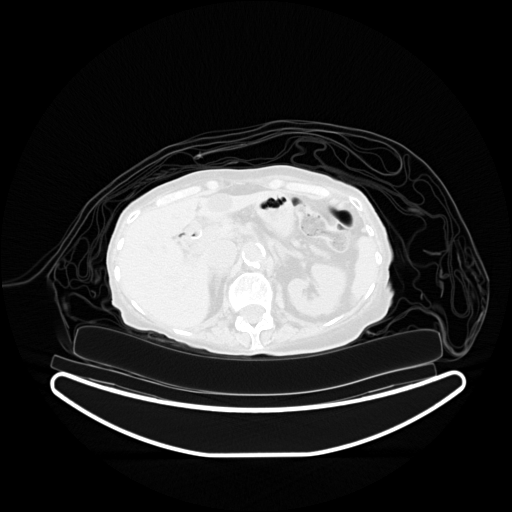
[im 251/251]
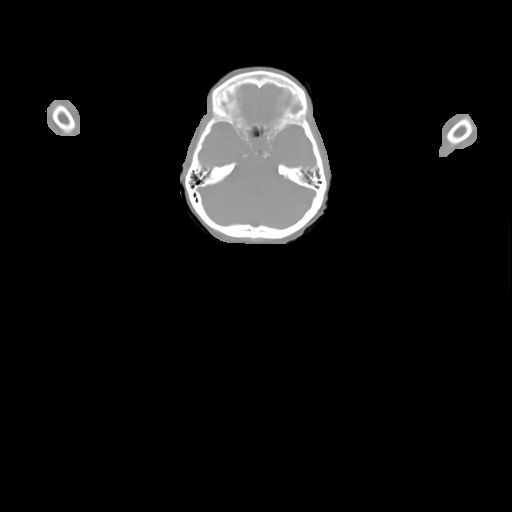

[Series 4: pet wb (ac) · axial · 5.0mm · 4.07mm/px · z∈[-1310,-560]mm · 3 of 251 slices shown]
[im 1/251]
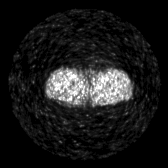
[im 126/251]
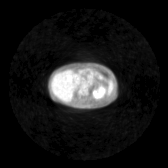
[im 251/251]
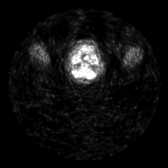

[Series 5: pet wb uncorrected (nac) · axial · 5.0mm · 4.07mm/px · z∈[-934,-560]mm · 2 of 251 slices shown]
[im 126/251]
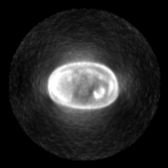
[im 251/251]
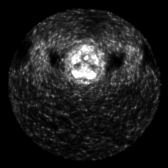

[Series 603: fused axial · 4 of 248 slices shown]
[im 1/248]
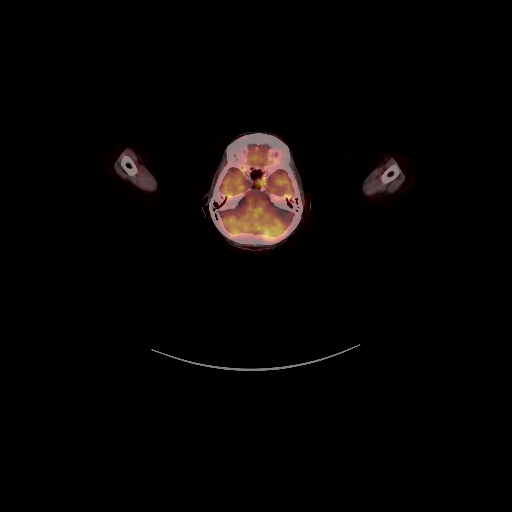
[im 83/248]
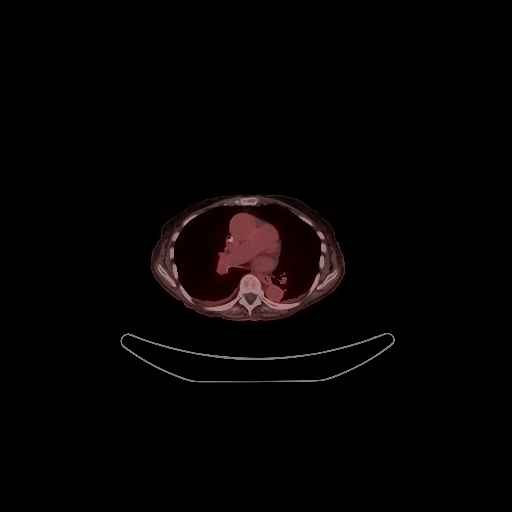
[im 165/248]
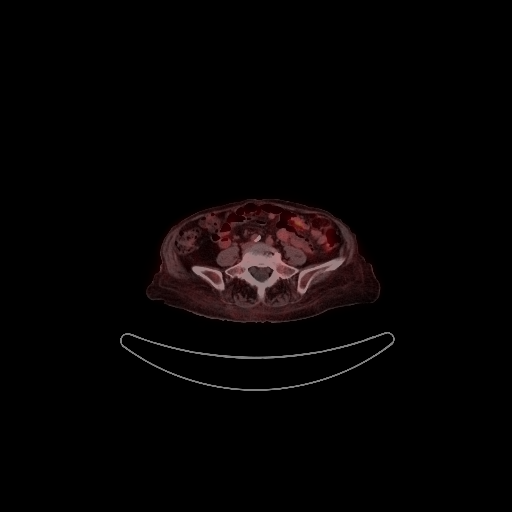
[im 248/248]
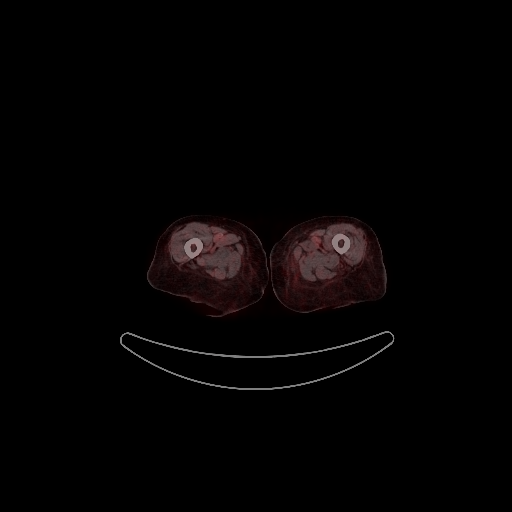

[Series 604: fused coronal · 1 of 93 slices shown]
[im 1/93]
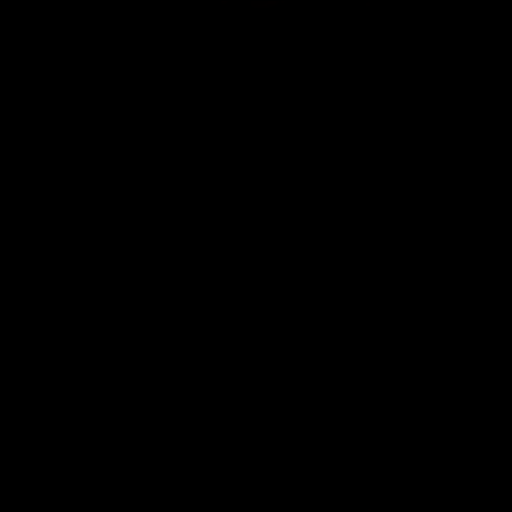

[Series 605: fused sagittal · 2 of 131 slices shown]
[im 1/131]
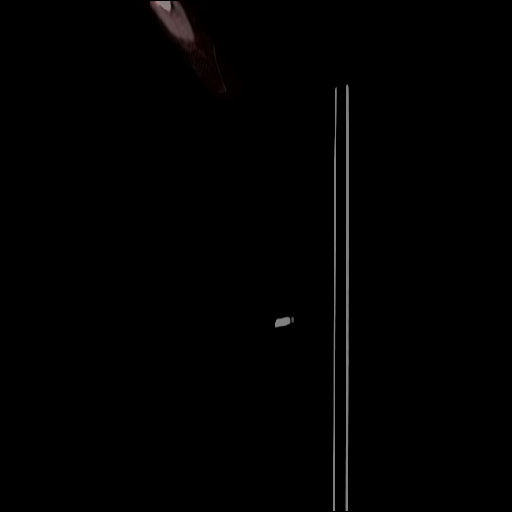
[im 131/131]
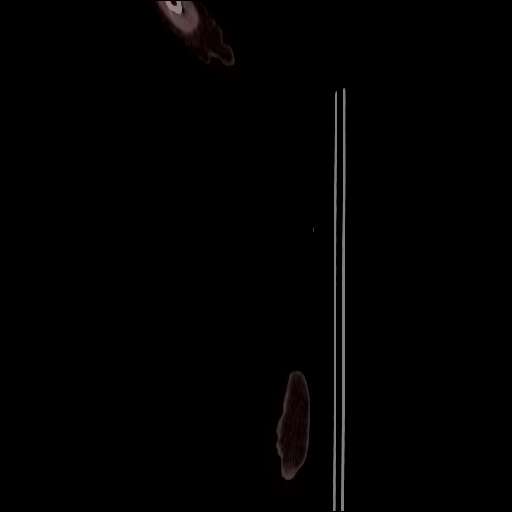

[Series 606: pet axial · 3 of 249 slices shown]
[im 1/249]
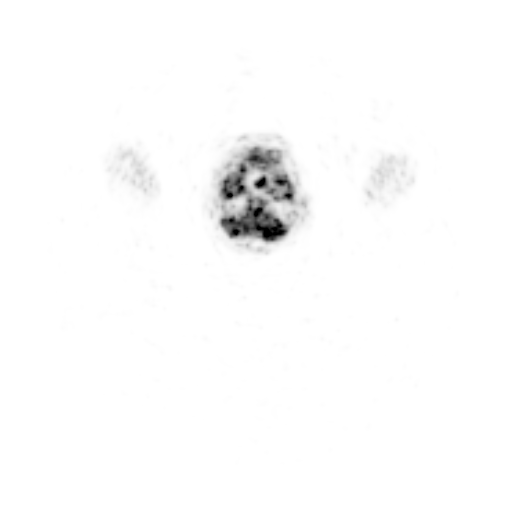
[im 83/249]
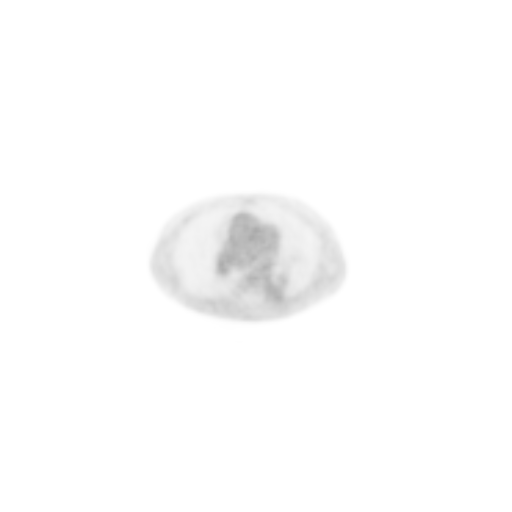
[im 249/249]
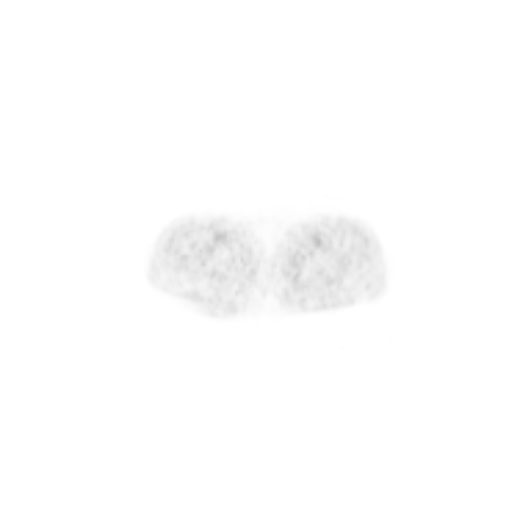

[Series 607: pet coronal · 1 of 97 slices shown]
[im 1/97]
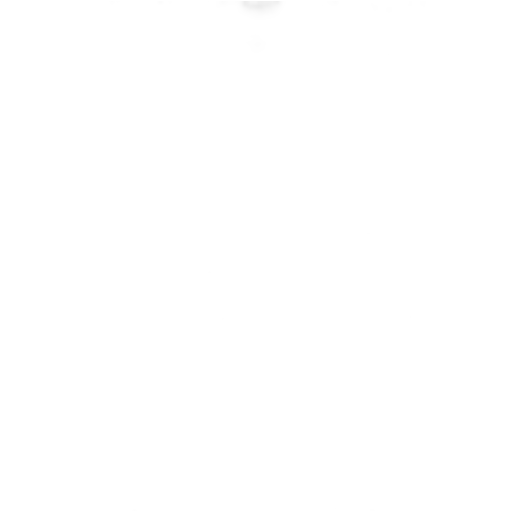

[Series 608: pet sagittal · 2 of 141 slices shown]
[im 1/141]
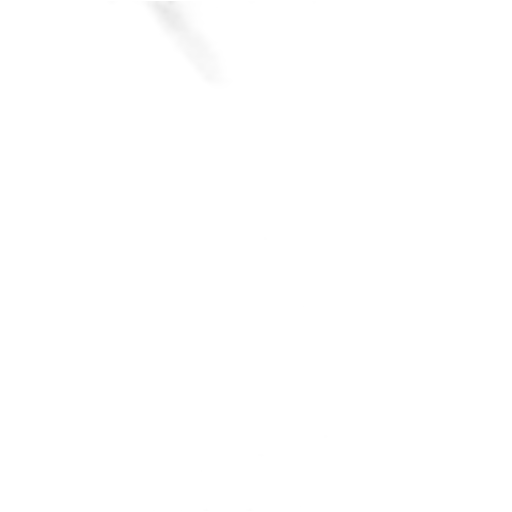
[im 141/141]
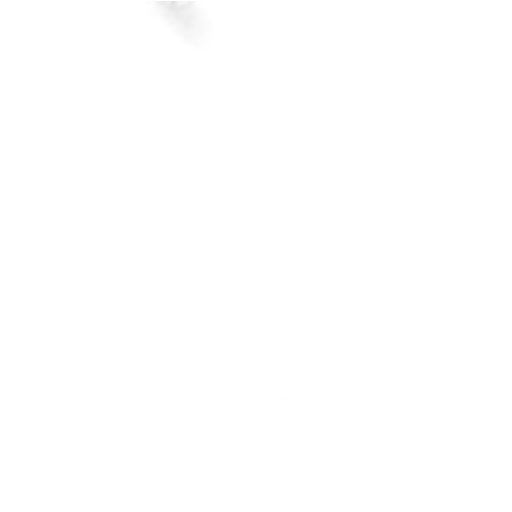

[Series 1116: results mm oncology reading · 1.18mm/px · 1 of 2 slices shown (1 of 2)]
[im 1/2]
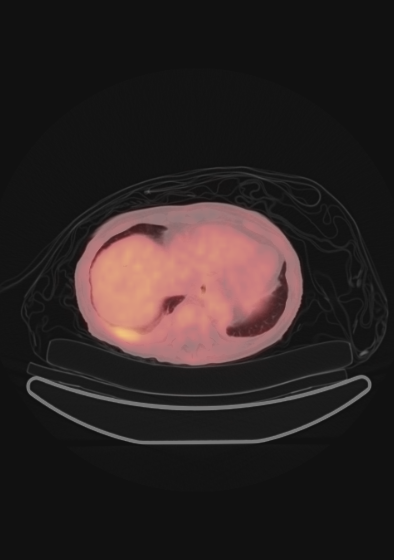

[Series 1284: results mm oncology reading · 1.09mm/px · 1 of 4 slices shown (2 of 2)]
[im 1/4]
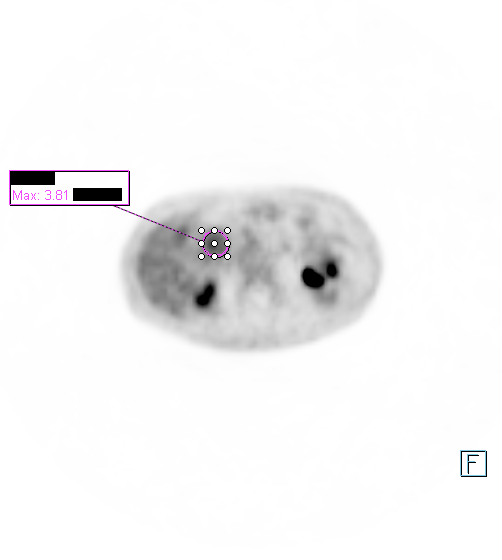

[23 of 25 positions shown; findings below may reference images not displayed]

FINDINGS: NECK

No hypermetabolic lymph nodes in the neck. CT images show no acute
findings.

CHEST

Mild hypermetabolism associated with subpleural atelectasis in the
right lower lobe. No hypermetabolic mediastinal, hilar or axillary
lymph nodes. No hypermetabolic pulmonary nodules. CT images show a
right IJ Port-A-Cath terminating in the low SVC. 1.3 cm
low-attenuation nodule in the right lobe of the thyroid.
Atherosclerotic calcification of the arterial vasculature, including
three-vessel involvement of the coronary arteries. Heart is
enlarged. No pericardial effusion. Small pleural effusions, right
greater than left.

ABDOMEN/PELVIS

Mild hypermetabolism associated with a pancreatic head mass,
measuring 2.5 x 3.3 cm with an SUV max of 3.8 (previously 5.2. No
residual hypermetabolism within the liver. No abnormal
hypermetabolism in the adrenal glands or spleen. No hypermetabolic
lymph nodes. CT images show pneumobilia with a common bile duct
stent in place. 2.7 cm low-attenuation lesion in the left hepatic
lobe is likely a cyst. Cholecystectomy. Adrenal glands and right
kidney are unremarkable. Low-attenuation lesions in the left kidney
measure up to 6.0 cm and are difficult to definitively characterize
without post-contrast imaging. Spleen is unremarkable. Pancreatic
head mass is described above with associated atrophy of the body and
tail. Stomach and bowel are grossly unremarkable. No free fluid.

SKELETON

No abnormal osseous hypermetabolism. Degenerative changes are seen
in the spine.
IMPRESSION: 1. Mild residual hypermetabolism within a pancreatic head mass.
2. Previously seen hypermetabolic liver lesions are not readily
identified.
3. Small bilateral pleural effusions.
4. Pneumobilia with a common bile duct stent in place.
5. Three-vessel coronary artery calcification.

## 2015-10-30 ENCOUNTER — Inpatient Hospital Stay (HOSPITAL_BASED_OUTPATIENT_CLINIC_OR_DEPARTMENT_OTHER): Payer: Medicare Other | Admitting: Oncology

## 2015-10-30 ENCOUNTER — Inpatient Hospital Stay: Payer: Medicare Other

## 2015-10-30 VITALS — BP 119/89 | HR 84 | Temp 97.9°F | Resp 16 | Wt 111.3 lb

## 2015-10-30 DIAGNOSIS — T451X5S Adverse effect of antineoplastic and immunosuppressive drugs, sequela: Secondary | ICD-10-CM | POA: Diagnosis not present

## 2015-10-30 DIAGNOSIS — Z5111 Encounter for antineoplastic chemotherapy: Secondary | ICD-10-CM | POA: Diagnosis not present

## 2015-10-30 DIAGNOSIS — E1165 Type 2 diabetes mellitus with hyperglycemia: Secondary | ICD-10-CM

## 2015-10-30 DIAGNOSIS — Z79899 Other long term (current) drug therapy: Secondary | ICD-10-CM

## 2015-10-30 DIAGNOSIS — K5900 Constipation, unspecified: Secondary | ICD-10-CM

## 2015-10-30 DIAGNOSIS — C259 Malignant neoplasm of pancreas, unspecified: Secondary | ICD-10-CM | POA: Diagnosis not present

## 2015-10-30 DIAGNOSIS — R600 Localized edema: Secondary | ICD-10-CM

## 2015-10-30 DIAGNOSIS — R531 Weakness: Secondary | ICD-10-CM

## 2015-10-30 DIAGNOSIS — E039 Hypothyroidism, unspecified: Secondary | ICD-10-CM

## 2015-10-30 DIAGNOSIS — C787 Secondary malignant neoplasm of liver and intrahepatic bile duct: Secondary | ICD-10-CM

## 2015-10-30 DIAGNOSIS — D6481 Anemia due to antineoplastic chemotherapy: Secondary | ICD-10-CM | POA: Diagnosis not present

## 2015-10-30 DIAGNOSIS — E871 Hypo-osmolality and hyponatremia: Secondary | ICD-10-CM

## 2015-10-30 DIAGNOSIS — R5383 Other fatigue: Secondary | ICD-10-CM

## 2015-10-30 LAB — CBC WITH DIFFERENTIAL/PLATELET
Basophils Absolute: 0 10*3/uL (ref 0–0.1)
Basophils Relative: 1 %
EOS ABS: 0 10*3/uL (ref 0–0.7)
EOS PCT: 1 %
HCT: 31.4 % — ABNORMAL LOW (ref 35.0–47.0)
Hemoglobin: 10.5 g/dL — ABNORMAL LOW (ref 12.0–16.0)
LYMPHS ABS: 0.9 10*3/uL — AB (ref 1.0–3.6)
Lymphocytes Relative: 26 %
MCH: 31.1 pg (ref 26.0–34.0)
MCHC: 33.4 g/dL (ref 32.0–36.0)
MCV: 93.1 fL (ref 80.0–100.0)
MONO ABS: 0.6 10*3/uL (ref 0.2–0.9)
MONOS PCT: 17 %
Neutro Abs: 2 10*3/uL (ref 1.4–6.5)
Neutrophils Relative %: 55 %
PLATELETS: 266 10*3/uL (ref 150–440)
RBC: 3.37 MIL/uL — ABNORMAL LOW (ref 3.80–5.20)
RDW: 16.8 % — AB (ref 11.5–14.5)
WBC: 3.6 10*3/uL (ref 3.6–11.0)

## 2015-10-30 LAB — COMPREHENSIVE METABOLIC PANEL
ALK PHOS: 51 U/L (ref 38–126)
ALT: 12 U/L — ABNORMAL LOW (ref 14–54)
ANION GAP: 7 (ref 5–15)
AST: 21 U/L (ref 15–41)
Albumin: 3.2 g/dL — ABNORMAL LOW (ref 3.5–5.0)
BUN: 18 mg/dL (ref 6–20)
CALCIUM: 8.5 mg/dL — AB (ref 8.9–10.3)
CHLORIDE: 98 mmol/L — AB (ref 101–111)
CO2: 25 mmol/L (ref 22–32)
Creatinine, Ser: 0.62 mg/dL (ref 0.44–1.00)
GFR calc non Af Amer: >60 mL/min (ref >60)
Glucose, Bld: 203 mg/dL — ABNORMAL HIGH (ref 65–99)
Potassium: 3.6 mmol/L (ref 3.5–5.1)
SODIUM: 130 mmol/L — AB (ref 135–145)
Total Bilirubin: 0.5 mg/dL (ref 0.3–1.2)
Total Protein: 6.6 g/dL (ref 6.5–8.1)

## 2015-10-30 MED ORDER — SODIUM CHLORIDE 0.9 % IV SOLN
1140.0000 mg | Freq: Once | INTRAVENOUS | Status: AC
  Administered 2015-10-30: 1140 mg via INTRAVENOUS
  Filled 2015-10-30: qty 26.32

## 2015-10-30 MED ORDER — SODIUM CHLORIDE 0.9 % IV SOLN
Freq: Once | INTRAVENOUS | Status: AC
  Administered 2015-10-30: 11:00:00 via INTRAVENOUS
  Filled 2015-10-30: qty 1000

## 2015-10-30 MED ORDER — PACLITAXEL PROTEIN-BOUND CHEMO INJECTION 100 MG
100.0000 mg/m2 | Freq: Once | INTRAVENOUS | Status: AC
  Administered 2015-10-30: 150 mg via INTRAVENOUS
  Filled 2015-10-30: qty 30

## 2015-10-30 MED ORDER — SODIUM CHLORIDE 0.9 % IJ SOLN
10.0000 mL | INTRAMUSCULAR | Status: DC | PRN
  Administered 2015-10-30: 10 mL via INTRAVENOUS
  Filled 2015-10-30: qty 10

## 2015-10-30 MED ORDER — SODIUM CHLORIDE 0.9 % IV SOLN
Freq: Once | INTRAVENOUS | Status: AC
  Administered 2015-10-30: 11:00:00 via INTRAVENOUS
  Filled 2015-10-30: qty 4

## 2015-10-30 MED ORDER — HEPARIN SOD (PORK) LOCK FLUSH 100 UNIT/ML IV SOLN
500.0000 [IU] | Freq: Once | INTRAVENOUS | Status: AC
  Administered 2015-10-30: 500 [IU] via INTRAVENOUS

## 2015-10-30 NOTE — Progress Notes (Signed)
Patient is doing well and offers no concerns today.  She has been eating prunes and taking Miralax daily which has improved the constipation.

## 2015-11-02 NOTE — Progress Notes (Signed)
[place]  Telephone:(336) [phone] Fax:(336) [phone]  ID: Amy Mckenzie OB: 1921/07/17  MR#: [id number]  [id number]  Patient Care Team: Lloyd Huger, MD as PCP - General (Oncology)  CHIEF COMPLAINT:  Chief Complaint  Patient presents with  . Pancreatic Cancer    INTERVAL HISTORY: Patient returns to clinic today for further evaluation and consideration of cycle 3, day 15 of gemcitabine and Abraxane. Her weakness and fatigued have improved. She continues to have increased peripheral edema. She has no neurologic complaints. She denies any recent fevers or illnesses. She denies any weight loss. She has no chest pain or shortness of breath. She denies any abdominal pain. She denies any nausea, vomiting, or diarrhea. She does have occasional constipation. She has no urinary complaints. Patient offers no further specific complaints today.  REVIEW OF SYSTEMS:   Review of Systems  Constitutional: Positive for malaise/fatigue. Negative for fever.  Eyes: Negative.   Respiratory: Negative.   Cardiovascular: Positive for leg swelling.  Gastrointestinal: Positive for constipation. Negative for nausea, vomiting, abdominal pain and diarrhea.  Musculoskeletal: Negative.   Neurological: Negative.     As per HPI. Otherwise, a complete review of systems is negatve.  PAST MEDICAL HISTORY: Past Medical History  Diagnosis Date  . Hypothyroidism   . Skin cancer   . Pancreatic cancer ([place])   . GERD (gastroesophageal reflux disease)   . Diabetes mellitus without complication ([place])   . Osteoporosis     PAST SURGICAL HISTORY: Past Surgical History  Procedure Laterality Date  . Cholecystectomy      FAMILY HISTORY: Reviewed and unchanged. No reported history of chronic disease or malignancy.     ADVANCED DIRECTIVES:    HEALTH MAINTENANCE: Social History  Substance Use Topics  . Smoking status: Never Smoker   . Smokeless tobacco: Never Used  . Alcohol Use:  Yes     Comment: occasional     Colonoscopy:  PAP:  Bone density:  Lipid panel:  No Known Allergies  Current Outpatient Prescriptions  Medication Sig Dispense Refill  . ALPRAZolam (XANAX) 0.25 MG tablet   0  . aspirin 81 MG tablet Take 81 mg by mouth daily.    . furosemide (LASIX) 40 MG tablet Take 1 tablet (40 mg total) by mouth daily. 30 tablet 5  . levothyroxine (SYNTHROID, LEVOTHROID) 25 MCG tablet   3  . lidocaine-prilocaine (EMLA) cream     . polyethylene glycol powder (GLYCOLAX/MIRALAX) powder Take 1 Container by mouth once as needed.    . prochlorperazine (COMPAZINE) 10 MG tablet   0  . propranolol (INDERAL) 40 MG tablet Take 40 mg by mouth 2 (two) times daily.    . raloxifene (EVISTA) 60 MG tablet Take 60 mg by mouth daily.     No current facility-administered medications for this visit.    OBJECTIVE: Filed Vitals:   10/30/15 0950  BP: 119/89  Pulse: 84  Temp: 97.9 F (36.6 C)  Resp: 16     Body mass index is 20.36 kg/(m^2).    ECOG FS:2 - Symptomatic, <50% confined to bed  General: Well-developed, well-nourished, no acute distress. Eyes: anicteric sclera. Lungs: Clear to auscultation bilaterally. Heart: Regular rate and rhythm. No rubs, murmurs, or gallops. Abdomen: Soft, nontender, nondistended. No organomegaly noted, normoactive bowel sounds. Musculoskeletal: 1-2+ edema on right leg, 2-3+ edema on left leg.  Neuro: Alert, answering all questions appropriately. Cranial nerves grossly intact. Skin: No rashes or petechiae noted. Psych: Normal affect.   LAB RESULTS:  Lab  Results  Component Value Date   NA 130* 10/30/2015   K 3.6 10/30/2015   CL 98* 10/30/2015   CO2 25 10/30/2015   GLUCOSE 203* 10/30/2015   BUN 18 10/30/2015   CREATININE 0.62 10/30/2015   CALCIUM 8.5* 10/30/2015   PROT 6.6 10/30/2015   ALBUMIN 3.2* 10/30/2015   AST 21 10/30/2015   ALT 12* 10/30/2015   ALKPHOS 51 10/30/2015   BILITOT 0.5 10/30/2015   GFRNONAA >60 10/30/2015    GFRAA >60 10/30/2015    Lab Results  Component Value Date   WBC 3.6 10/30/2015   NEUTROABS 2.0 10/30/2015   HGB 10.5* 10/30/2015   HCT 31.4* 10/30/2015   MCV 93.1 10/30/2015   PLT 266 10/30/2015   Lab Results  Component Value Date   CA199 2728* 10/16/2015    STUDIES: No results found.  ASSESSMENT: Stage IV pancreatic cancer with liver metastasis.  PLAN:    1. Pancreatic cancer: PET scan results from September 25, 2015 reviewed independently and essentially revealed stable disease. Her CA-19-9 has trended up, continue to monitor. Proceed with cycle 3, day 15 of gemcitabine and Abraxane. Patient may only be able to tolerate treatments on day 1 and 15 for the remainder of her cycles. Return to clinic in 2 weeks for consideration of cycle 4, day 1. 2. Hyperbilirubinemia: Bilirubin is now within normal limits after stenting and ERCP. Monitor. 3. Hyperglycemia: Monitor. Continue current medications. 4. Neutropenia: Resolved. 5. Anemia: Secondary chemotherapy, patient is approximately at her baseline. Monitor.  6. Hyponatremia: Mild, have recommended patient decrease her fluid intake. 7. Edema: Continue Lasix as prescribed. She instructed to continue as needed with caution given her hyponatremia.  8. Weakness and fatigue: Also factorial including chemotherapy, malignancy, and advancing age. 9. Constipation: Continue OTC miralax as needed.   Patient expressed understanding and was in agreement with this plan. She also understands that She can call clinic at any time with any questions, concerns, or complaints.   Pancreatic cancer metastasized to liver   Staging form: Pancreas, AJCC 7th Edition     Clinical stage from 03/29/2015: Stage IV (TX, N0, M1) - Signed by Lloyd Huger, MD on 03/29/2015   Lloyd Huger, MD   11/02/2015 5:48 PM

## 2015-11-12 ENCOUNTER — Other Ambulatory Visit: Payer: Self-pay | Admitting: *Deleted

## 2015-11-12 DIAGNOSIS — C787 Secondary malignant neoplasm of liver and intrahepatic bile duct: Principal | ICD-10-CM

## 2015-11-12 DIAGNOSIS — C259 Malignant neoplasm of pancreas, unspecified: Secondary | ICD-10-CM

## 2015-11-13 ENCOUNTER — Inpatient Hospital Stay: Payer: Medicare Other

## 2015-11-13 ENCOUNTER — Inpatient Hospital Stay: Payer: Medicare Other | Attending: Oncology

## 2015-11-13 ENCOUNTER — Inpatient Hospital Stay (HOSPITAL_BASED_OUTPATIENT_CLINIC_OR_DEPARTMENT_OTHER): Payer: Medicare Other | Admitting: Oncology

## 2015-11-13 VITALS — BP 132/85 | HR 69 | Temp 97.3°F | Resp 16 | Wt 112.0 lb

## 2015-11-13 DIAGNOSIS — E871 Hypo-osmolality and hyponatremia: Secondary | ICD-10-CM | POA: Insufficient documentation

## 2015-11-13 DIAGNOSIS — C259 Malignant neoplasm of pancreas, unspecified: Secondary | ICD-10-CM | POA: Diagnosis not present

## 2015-11-13 DIAGNOSIS — K5900 Constipation, unspecified: Secondary | ICD-10-CM

## 2015-11-13 DIAGNOSIS — Z7982 Long term (current) use of aspirin: Secondary | ICD-10-CM | POA: Diagnosis not present

## 2015-11-13 DIAGNOSIS — M810 Age-related osteoporosis without current pathological fracture: Secondary | ICD-10-CM

## 2015-11-13 DIAGNOSIS — Z5111 Encounter for antineoplastic chemotherapy: Secondary | ICD-10-CM | POA: Insufficient documentation

## 2015-11-13 DIAGNOSIS — Z85828 Personal history of other malignant neoplasm of skin: Secondary | ICD-10-CM | POA: Insufficient documentation

## 2015-11-13 DIAGNOSIS — E1165 Type 2 diabetes mellitus with hyperglycemia: Secondary | ICD-10-CM | POA: Insufficient documentation

## 2015-11-13 DIAGNOSIS — R5383 Other fatigue: Secondary | ICD-10-CM

## 2015-11-13 DIAGNOSIS — Z79899 Other long term (current) drug therapy: Secondary | ICD-10-CM | POA: Insufficient documentation

## 2015-11-13 DIAGNOSIS — E039 Hypothyroidism, unspecified: Secondary | ICD-10-CM

## 2015-11-13 DIAGNOSIS — K219 Gastro-esophageal reflux disease without esophagitis: Secondary | ICD-10-CM

## 2015-11-13 DIAGNOSIS — R600 Localized edema: Secondary | ICD-10-CM

## 2015-11-13 DIAGNOSIS — C787 Secondary malignant neoplasm of liver and intrahepatic bile duct: Secondary | ICD-10-CM | POA: Diagnosis not present

## 2015-11-13 DIAGNOSIS — R531 Weakness: Secondary | ICD-10-CM | POA: Diagnosis not present

## 2015-11-13 LAB — COMPREHENSIVE METABOLIC PANEL
ALT: 14 U/L (ref 14–54)
AST: 23 U/L (ref 15–41)
Albumin: 3.2 g/dL — ABNORMAL LOW (ref 3.5–5.0)
Alkaline Phosphatase: 53 U/L (ref 38–126)
Anion gap: 6 (ref 5–15)
BILIRUBIN TOTAL: 0.3 mg/dL (ref 0.3–1.2)
BUN: 17 mg/dL (ref 6–20)
CALCIUM: 8.1 mg/dL — AB (ref 8.9–10.3)
CHLORIDE: 98 mmol/L — AB (ref 101–111)
CO2: 26 mmol/L (ref 22–32)
CREATININE: 0.52 mg/dL (ref 0.44–1.00)
Glucose, Bld: 193 mg/dL — ABNORMAL HIGH (ref 65–99)
Potassium: 4.1 mmol/L (ref 3.5–5.1)
SODIUM: 130 mmol/L — AB (ref 135–145)
TOTAL PROTEIN: 6.2 g/dL — AB (ref 6.5–8.1)

## 2015-11-13 LAB — CBC WITH DIFFERENTIAL/PLATELET
Basophils Absolute: 0.1 10*3/uL (ref 0–0.1)
Basophils Relative: 2 %
EOS ABS: 0.1 10*3/uL (ref 0–0.7)
EOS PCT: 2 %
HCT: 32 % — ABNORMAL LOW (ref 35.0–47.0)
Hemoglobin: 10.5 g/dL — ABNORMAL LOW (ref 12.0–16.0)
LYMPHS ABS: 0.9 10*3/uL — AB (ref 1.0–3.6)
LYMPHS PCT: 28 %
MCH: 30.3 pg (ref 26.0–34.0)
MCHC: 32.8 g/dL (ref 32.0–36.0)
MCV: 92.4 fL (ref 80.0–100.0)
MONO ABS: 0.8 10*3/uL (ref 0.2–0.9)
Monocytes Relative: 23 %
Neutro Abs: 1.5 10*3/uL (ref 1.4–6.5)
Neutrophils Relative %: 45 %
PLATELETS: 259 10*3/uL (ref 150–440)
RBC: 3.46 MIL/uL — AB (ref 3.80–5.20)
RDW: 16.1 % — AB (ref 11.5–14.5)
WBC: 3.4 10*3/uL — AB (ref 3.6–11.0)

## 2015-11-13 MED ORDER — SODIUM CHLORIDE 0.9 % IV SOLN
1140.0000 mg | Freq: Once | INTRAVENOUS | Status: AC
  Administered 2015-11-13: 1140 mg via INTRAVENOUS
  Filled 2015-11-13: qty 26.3

## 2015-11-13 MED ORDER — PACLITAXEL PROTEIN-BOUND CHEMO INJECTION 100 MG
100.0000 mg/m2 | Freq: Once | INTRAVENOUS | Status: AC
  Administered 2015-11-13: 150 mg via INTRAVENOUS
  Filled 2015-11-13: qty 30

## 2015-11-13 MED ORDER — SODIUM CHLORIDE 0.9 % IJ SOLN
10.0000 mL | INTRAMUSCULAR | Status: DC | PRN
  Administered 2015-11-13: 10 mL via INTRAVENOUS
  Filled 2015-11-13: qty 10

## 2015-11-13 MED ORDER — SODIUM CHLORIDE 0.9 % IV SOLN
Freq: Once | INTRAVENOUS | Status: AC
  Administered 2015-11-13: 10:00:00 via INTRAVENOUS
  Filled 2015-11-13: qty 1000

## 2015-11-13 MED ORDER — HEPARIN SOD (PORK) LOCK FLUSH 100 UNIT/ML IV SOLN
500.0000 [IU] | Freq: Once | INTRAVENOUS | Status: AC
  Administered 2015-11-13: 500 [IU] via INTRAVENOUS

## 2015-11-13 MED ORDER — SODIUM CHLORIDE 0.9 % IV SOLN
Freq: Once | INTRAVENOUS | Status: AC
  Administered 2015-11-13: 10:00:00 via INTRAVENOUS
  Filled 2015-11-13: qty 4

## 2015-11-13 NOTE — Progress Notes (Signed)
Patient feeling fatigued.   

## 2015-11-14 LAB — CANCER ANTIGEN 19-9: CA 19-9: 3094 U/mL — ABNORMAL HIGH (ref 0–35)

## 2015-11-30 NOTE — Progress Notes (Signed)
[place]  Telephone:(336) [phone] [phone]) [phone]  ID: Amy Mckenzie OB: 1921/10/27  MR#: [id number]  [id number]  Patient Care Team: Lloyd Huger, MD as PCP - General (Oncology)  CHIEF COMPLAINT:  Chief Complaint  Patient presents with  . Pancreatic Cancer    INTERVAL HISTORY: Patient returns to clinic today for further evaluation and consideration of her next infusion of gemcitabine and Abraxane.  She admits to more fatigue today, but otherwise feels well. Her peripheral edema is unchanged. She has no neurologic complaints. She denies any recent fevers or illnesses. She denies any weight loss. She has no chest pain or shortness of breath. She denies any abdominal pain. She denies any nausea, vomiting, or diarrhea. She does have occasional constipation. She has no urinary complaints. Patient offers no further specific complaints today.  REVIEW OF SYSTEMS:   Review of Systems  Constitutional: Positive for malaise/fatigue. Negative for fever.  Eyes: Negative.   Respiratory: Negative.   Cardiovascular: Positive for leg swelling.  Gastrointestinal: Positive for constipation. Negative for nausea, vomiting, abdominal pain and diarrhea.  Musculoskeletal: Negative.   Neurological: Positive for weakness.    As per HPI. Otherwise, a complete review of systems is negatve.  PAST MEDICAL HISTORY: Past Medical History  Diagnosis Date  . Hypothyroidism   . Skin cancer   . Pancreatic cancer ([place])   . GERD (gastroesophageal reflux disease)   . Diabetes mellitus without complication ([place])   . Osteoporosis     PAST SURGICAL HISTORY: Past Surgical History  Procedure Laterality Date  . Cholecystectomy      FAMILY HISTORY: Reviewed and unchanged. No reported history of chronic disease or malignancy.     ADVANCED DIRECTIVES:    HEALTH MAINTENANCE: Social History  Substance Use Topics  . Smoking status: Never Smoker   . Smokeless tobacco: Never  Used  . Alcohol Use: Yes     Comment: occasional     Colonoscopy:  PAP:  Bone density:  Lipid panel:  No Known Allergies  Current Outpatient Prescriptions  Medication Sig Dispense Refill  . ALPRAZolam (XANAX) 0.25 MG tablet   0  . aspirin 81 MG tablet Take 81 mg by mouth daily.    . furosemide (LASIX) 40 MG tablet Take 1 tablet (40 mg total) by mouth daily. 30 tablet 5  . levothyroxine (SYNTHROID, LEVOTHROID) 25 MCG tablet   3  . lidocaine-prilocaine (EMLA) cream     . polyethylene glycol powder (GLYCOLAX/MIRALAX) powder Take 1 Container by mouth once as needed.    . prochlorperazine (COMPAZINE) 10 MG tablet   0  . propranolol (INDERAL) 40 MG tablet Take 40 mg by mouth 2 (two) times daily.    . raloxifene (EVISTA) 60 MG tablet Take 60 mg by mouth daily.     No current facility-administered medications for this visit.    OBJECTIVE: Filed Vitals:   11/13/15 0927  BP: 132/85  Pulse: 69  Temp: 97.3 F (36.3 C)  Resp: 16     Body mass index is 20.48 kg/(m^2).    ECOG FS:1 - Symptomatic but completely ambulatory  General: Well-developed, well-nourished, no acute distress. Eyes: anicteric sclera. Lungs: Clear to auscultation bilaterally. Heart: Regular rate and rhythm. No rubs, murmurs, or gallops. Abdomen: Soft, nontender, nondistended. No organomegaly noted, normoactive bowel sounds. Musculoskeletal: 1-2+ edema.  Neuro: Alert, answering all questions appropriately. Cranial nerves grossly intact. Skin: No rashes or petechiae noted. Psych: Normal affect.   LAB RESULTS:  Lab Results  Component Value Date  NA 130* 11/13/2015   K 4.1 11/13/2015   CL 98* 11/13/2015   CO2 26 11/13/2015   GLUCOSE 193* 11/13/2015   BUN 17 11/13/2015   CREATININE 0.52 11/13/2015   CALCIUM 8.1* 11/13/2015   PROT 6.2* 11/13/2015   ALBUMIN 3.2* 11/13/2015   AST 23 11/13/2015   ALT 14 11/13/2015   ALKPHOS 53 11/13/2015   BILITOT 0.3 11/13/2015   GFRNONAA >60 11/13/2015   GFRAA >60  11/13/2015    Lab Results  Component Value Date   WBC 3.4* 11/13/2015   NEUTROABS 1.5 11/13/2015   HGB 10.5* 11/13/2015   HCT 32.0* 11/13/2015   MCV 92.4 11/13/2015   PLT 259 11/13/2015   Lab Results  Component Value Date   CA199 3094* 11/13/2015    STUDIES: No results found.  ASSESSMENT: Stage IV pancreatic cancer with liver metastasis.  PLAN:    1. Pancreatic cancer: PET scan results from September 25, 2015 reviewed independently and essentially revealed stable disease. Her CA-19-9  Continues to trend up and is now greater than 3000. We will likely have to discontinue or switch treatments in the near future. Proceed with cycle 4, day 1 of gemcitabine and Abraxane. Patient may only be able to tolerate treatments on day 1 and 15 for the remainder of her cycles.  Patient wishes to have some time off over the Christmas holiday, therefore she will return to clinic in 3 weeks for further evaluation. Given her rising CA-19-9, will consider reimaging at that time. 2. Hyperbilirubinemia: Bilirubin is now within normal limits after stenting and ERCP. Monitor. 3. Hyperglycemia: Monitor. Continue current medications. 4. Neutropenia: Resolved. 5. Anemia: Secondary chemotherapy, patient is approximately at her baseline. Monitor.  6. Hyponatremia: Mild, have recommended patient decrease her fluid intake. 7. Edema: Continue Lasix as prescribed. She instructed to continue as needed with caution given her hyponatremia.  8. Weakness and fatigue: Also factorial including chemotherapy, malignancy, and advancing age. 9. Constipation: Continue OTC miralax as needed.   Patient expressed understanding and was in agreement with this plan. She also understands that She can call clinic at any time with any questions, concerns, or complaints.   Pancreatic cancer metastasized to liver   Staging form: Pancreas, AJCC 7th Edition     Clinical stage from 03/29/2015: Stage IV (TX, N0, M1) - Signed by Lloyd Huger, MD on 03/29/2015   Lloyd Huger, MD   11/30/2015 10:03 AM

## 2015-12-03 ENCOUNTER — Other Ambulatory Visit: Payer: Self-pay | Admitting: *Deleted

## 2015-12-03 DIAGNOSIS — C787 Secondary malignant neoplasm of liver and intrahepatic bile duct: Principal | ICD-10-CM

## 2015-12-03 DIAGNOSIS — C259 Malignant neoplasm of pancreas, unspecified: Secondary | ICD-10-CM

## 2015-12-04 ENCOUNTER — Inpatient Hospital Stay: Payer: Medicare Other | Attending: Oncology

## 2015-12-04 ENCOUNTER — Inpatient Hospital Stay: Payer: Medicare Other

## 2015-12-04 ENCOUNTER — Inpatient Hospital Stay (HOSPITAL_BASED_OUTPATIENT_CLINIC_OR_DEPARTMENT_OTHER): Payer: Medicare Other | Admitting: Oncology

## 2015-12-04 VITALS — BP 135/80 | HR 67 | Temp 98.1°F | Resp 16 | Wt 112.9 lb

## 2015-12-04 DIAGNOSIS — C259 Malignant neoplasm of pancreas, unspecified: Secondary | ICD-10-CM | POA: Diagnosis not present

## 2015-12-04 DIAGNOSIS — R5381 Other malaise: Secondary | ICD-10-CM | POA: Insufficient documentation

## 2015-12-04 DIAGNOSIS — R600 Localized edema: Secondary | ICD-10-CM | POA: Diagnosis not present

## 2015-12-04 DIAGNOSIS — R5383 Other fatigue: Secondary | ICD-10-CM | POA: Insufficient documentation

## 2015-12-04 DIAGNOSIS — R978 Other abnormal tumor markers: Secondary | ICD-10-CM | POA: Insufficient documentation

## 2015-12-04 DIAGNOSIS — K5900 Constipation, unspecified: Secondary | ICD-10-CM | POA: Insufficient documentation

## 2015-12-04 DIAGNOSIS — Z79899 Other long term (current) drug therapy: Secondary | ICD-10-CM | POA: Insufficient documentation

## 2015-12-04 DIAGNOSIS — E1165 Type 2 diabetes mellitus with hyperglycemia: Secondary | ICD-10-CM

## 2015-12-04 DIAGNOSIS — T451X5S Adverse effect of antineoplastic and immunosuppressive drugs, sequela: Secondary | ICD-10-CM | POA: Insufficient documentation

## 2015-12-04 DIAGNOSIS — Z85828 Personal history of other malignant neoplasm of skin: Secondary | ICD-10-CM | POA: Insufficient documentation

## 2015-12-04 DIAGNOSIS — M810 Age-related osteoporosis without current pathological fracture: Secondary | ICD-10-CM | POA: Diagnosis not present

## 2015-12-04 DIAGNOSIS — K219 Gastro-esophageal reflux disease without esophagitis: Secondary | ICD-10-CM | POA: Diagnosis not present

## 2015-12-04 DIAGNOSIS — C787 Secondary malignant neoplasm of liver and intrahepatic bile duct: Secondary | ICD-10-CM | POA: Diagnosis not present

## 2015-12-04 DIAGNOSIS — E039 Hypothyroidism, unspecified: Secondary | ICD-10-CM | POA: Diagnosis not present

## 2015-12-04 DIAGNOSIS — Z5111 Encounter for antineoplastic chemotherapy: Secondary | ICD-10-CM | POA: Diagnosis present

## 2015-12-04 DIAGNOSIS — Z7982 Long term (current) use of aspirin: Secondary | ICD-10-CM | POA: Diagnosis not present

## 2015-12-04 DIAGNOSIS — D6481 Anemia due to antineoplastic chemotherapy: Secondary | ICD-10-CM

## 2015-12-04 DIAGNOSIS — E871 Hypo-osmolality and hyponatremia: Secondary | ICD-10-CM | POA: Diagnosis not present

## 2015-12-04 DIAGNOSIS — R531 Weakness: Secondary | ICD-10-CM | POA: Diagnosis not present

## 2015-12-04 LAB — CBC WITH DIFFERENTIAL/PLATELET
BASOS PCT: 1 %
Basophils Absolute: 0 10*3/uL (ref 0–0.1)
Eosinophils Absolute: 0.1 10*3/uL (ref 0–0.7)
Eosinophils Relative: 3 %
HEMATOCRIT: 32.8 % — AB (ref 35.0–47.0)
HEMOGLOBIN: 10.8 g/dL — AB (ref 12.0–16.0)
LYMPHS ABS: 1.1 10*3/uL (ref 1.0–3.6)
LYMPHS PCT: 26 %
MCH: 30.3 pg (ref 26.0–34.0)
MCHC: 32.8 g/dL (ref 32.0–36.0)
MCV: 92.2 fL (ref 80.0–100.0)
MONO ABS: 0.7 10*3/uL (ref 0.2–0.9)
MONOS PCT: 15 %
NEUTROS ABS: 2.4 10*3/uL (ref 1.4–6.5)
NEUTROS PCT: 55 %
Platelets: 346 10*3/uL (ref 150–440)
RBC: 3.55 MIL/uL — ABNORMAL LOW (ref 3.80–5.20)
RDW: 16.1 % — ABNORMAL HIGH (ref 11.5–14.5)
WBC: 4.4 10*3/uL (ref 3.6–11.0)

## 2015-12-04 LAB — COMPREHENSIVE METABOLIC PANEL
ALBUMIN: 3 g/dL — AB (ref 3.5–5.0)
ALK PHOS: 72 U/L (ref 38–126)
ALT: 10 U/L — ABNORMAL LOW (ref 14–54)
ANION GAP: 6 (ref 5–15)
AST: 16 U/L (ref 15–41)
BILIRUBIN TOTAL: 0.3 mg/dL (ref 0.3–1.2)
BUN: 15 mg/dL (ref 6–20)
CALCIUM: 8.1 mg/dL — AB (ref 8.9–10.3)
CO2: 27 mmol/L (ref 22–32)
Chloride: 98 mmol/L — ABNORMAL LOW (ref 101–111)
Creatinine, Ser: 0.66 mg/dL (ref 0.44–1.00)
GFR calc Af Amer: >60 mL/min (ref >60)
Glucose, Bld: 221 mg/dL — ABNORMAL HIGH (ref 65–99)
Potassium: 3.8 mmol/L (ref 3.5–5.1)
Sodium: 131 mmol/L — ABNORMAL LOW (ref 135–145)
TOTAL PROTEIN: 6.3 g/dL — AB (ref 6.5–8.1)

## 2015-12-04 MED ORDER — SODIUM CHLORIDE 0.9 % IV SOLN
Freq: Once | INTRAVENOUS | Status: AC
  Administered 2015-12-04: 11:00:00 via INTRAVENOUS
  Filled 2015-12-04: qty 1000

## 2015-12-04 MED ORDER — HEPARIN SOD (PORK) LOCK FLUSH 100 UNIT/ML IV SOLN
500.0000 [IU] | Freq: Once | INTRAVENOUS | Status: AC
  Administered 2015-12-04: 500 [IU] via INTRAVENOUS
  Filled 2015-12-04: qty 5

## 2015-12-04 MED ORDER — PACLITAXEL PROTEIN-BOUND CHEMO INJECTION 100 MG
100.0000 mg/m2 | Freq: Once | INTRAVENOUS | Status: AC
  Administered 2015-12-04: 150 mg via INTRAVENOUS
  Filled 2015-12-04: qty 30

## 2015-12-04 MED ORDER — SODIUM CHLORIDE 0.9 % IJ SOLN
10.0000 mL | INTRAMUSCULAR | Status: DC | PRN
  Administered 2015-12-04: 10 mL via INTRAVENOUS
  Filled 2015-12-04: qty 10

## 2015-12-04 MED ORDER — SODIUM CHLORIDE 0.9 % IV SOLN
Freq: Once | INTRAVENOUS | Status: AC
  Administered 2015-12-04: 11:00:00 via INTRAVENOUS
  Filled 2015-12-04: qty 4

## 2015-12-04 MED ORDER — SODIUM CHLORIDE 0.9 % IV SOLN
1140.0000 mg | Freq: Once | INTRAVENOUS | Status: AC
  Administered 2015-12-04: 1140 mg via INTRAVENOUS
  Filled 2015-12-04: qty 26.3

## 2015-12-04 NOTE — Progress Notes (Signed)
Patient had a cold with congestion a couple of weeks ago and her fatigue increased during this time.  She also had a syncopal episode last week and was not evaluated afterwards.

## 2015-12-04 NOTE — Progress Notes (Signed)
Amy Mckenzie  Telephone:(336) [phone] Fax:(336) [phone]  ID: Amy Mckenzie OB: 05-09-1921  MR#: [id number]  [id number]  Patient Care Team: Lloyd Huger, MD as PCP - General (Oncology)  CHIEF COMPLAINT:  Chief Complaint  Patient presents with  . Pancreatic Cancer    INTERVAL HISTORY: Patient returns to clinic today for further evaluation and consideration of cycle 4 day 15 gemcitabine and Abraxane.  She still complains of  fatigue today, but otherwise feels well. Her peripheral edema is unchanged. She has no neurologic complaints. She denies any recent fevers or illnesses. She denies any weight loss. She has no chest pain or shortness of breath. She denies any abdominal pain. She denies any nausea, vomiting, or diarrhea. She does have occasional constipation. She has no urinary complaints. Patient offers no further specific complaints today.  REVIEW OF SYSTEMS:   Review of Systems  Constitutional: Positive for malaise/fatigue. Negative for fever.  Eyes: Negative.   Respiratory: Negative.   Cardiovascular: Positive for leg swelling.  Gastrointestinal: Positive for constipation. Negative for nausea, vomiting, abdominal pain and diarrhea.  Musculoskeletal: Negative.   Neurological: Positive for weakness.    As per HPI. Otherwise, a complete review of systems is negatve.  PAST MEDICAL HISTORY: Past Medical History  Diagnosis Date  . Hypothyroidism   . Skin cancer   . Pancreatic cancer ([place])   . GERD (gastroesophageal reflux disease)   . Diabetes mellitus without complication ([place])   . Osteoporosis     PAST SURGICAL HISTORY: Past Surgical History  Procedure Laterality Date  . Cholecystectomy      FAMILY HISTORY: Reviewed and unchanged. No reported history of chronic disease or malignancy.     ADVANCED DIRECTIVES:    HEALTH MAINTENANCE: Social History  Substance Use Topics  . Smoking status: Never Smoker   . Smokeless tobacco: Never  Used  . Alcohol Use: Yes     Comment: occasional     Colonoscopy:  PAP:  Bone density:  Lipid panel:  No Known Allergies  Current Outpatient Prescriptions  Medication Sig Dispense Refill  . ALPRAZolam (XANAX) 0.25 MG tablet   0  . aspirin 81 MG tablet Take 81 mg by mouth daily.    . furosemide (LASIX) 40 MG tablet Take 1 tablet (40 mg total) by mouth daily. 30 tablet 5  . levothyroxine (SYNTHROID, LEVOTHROID) 25 MCG tablet   3  . lidocaine-prilocaine (EMLA) cream     . polyethylene glycol powder (GLYCOLAX/MIRALAX) powder Take 1 Container by mouth once as needed.    . prochlorperazine (COMPAZINE) 10 MG tablet   0  . propranolol (INDERAL) 40 MG tablet Take 40 mg by mouth 2 (two) times daily.    . raloxifene (EVISTA) 60 MG tablet Take 60 mg by mouth daily.     No current facility-administered medications for this visit.   Facility-Administered Medications Ordered in Other Visits  Medication Dose Route Frequency Provider Last Rate Last Dose  . gemcitabine (GEMZAR) 1,140 mg in sodium chloride 0.9 % 100 mL chemo infusion  1,140 mg Intravenous Once Lloyd Huger, MD 260 mL/hr at 12/04/15 1318 1,140 mg at 12/04/15 1318  . heparin lock flush 100 unit/mL  500 Units Intravenous Once Lloyd Huger, MD      . sodium chloride 0.9 % injection 10 mL  10 mL Intravenous PRN Lloyd Huger, MD   10 mL at 12/04/15 0930    OBJECTIVE: Filed Vitals:   12/04/15 0957  BP: 135/80  Pulse: 67  Temp: 98.1 F (36.7 C)  Resp: 16     Body mass index is 20.64 kg/(m^2).    ECOG FS:1 - Symptomatic but completely ambulatory  General: Well-developed, well-nourished, no acute distress. Eyes: anicteric sclera. Lungs: Clear to auscultation bilaterally. Heart: Regular rate and rhythm. No rubs, murmurs, or gallops. Abdomen: Soft, nontender, nondistended. No organomegaly noted, normoactive bowel sounds. Musculoskeletal: 1-2+ edema.  Neuro: Alert, answering all questions appropriately. Cranial  nerves grossly intact. Skin: No rashes or petechiae noted. Psych: Normal affect.   LAB RESULTS:  Lab Results  Component Value Date   NA 131* 12/04/2015   K 3.8 12/04/2015   CL 98* 12/04/2015   CO2 27 12/04/2015   GLUCOSE 221* 12/04/2015   BUN 15 12/04/2015   CREATININE 0.66 12/04/2015   CALCIUM 8.1* 12/04/2015   PROT 6.3* 12/04/2015   ALBUMIN 3.0* 12/04/2015   AST 16 12/04/2015   ALT 10* 12/04/2015   ALKPHOS 72 12/04/2015   BILITOT 0.3 12/04/2015   GFRNONAA >60 12/04/2015   GFRAA >60 12/04/2015    Lab Results  Component Value Date   WBC 4.4 12/04/2015   NEUTROABS 2.4 12/04/2015   HGB 10.8* 12/04/2015   HCT 32.8* 12/04/2015   MCV 92.2 12/04/2015   PLT 346 12/04/2015   Lab Results  Component Value Date   CA199 3094* 11/13/2015    STUDIES: No results found.  ASSESSMENT: Stage IV pancreatic cancer with liver metastasis.  PLAN:    1. Pancreatic cancer: Patient had 3 cycles (9 total infusions) of gemcitabine/abraxane completing May 01, 2015. She was given a break and treatment was resumed on July 10, 2015 and documented as cycle 1 day 1. PET scan results from September 25, 2015 reviewed independently and essentially revealed stable disease. Her CA-19-9  Continues to trend up and is now greater than 3000. Will likely have to discontinue or switch treatments in the near future. Proceed with cycle 4, day 15 of gemcitabine/abraxane today. Patient may only be able to tolerate treatments on day 1 and 15 for the remainder of her cycles.  She will return to clinic in 2 weeks for Pet scan results, further evaluation and possible Cycle 5 day 1 of gemzar/abraxane.  2. Hyperbilirubinemia: Bilirubin is now within normal limits after stenting and ERCP. Monitor. 3. Hyperglycemia: Monitor. Continue current medications. 4. Constipation: Continue OTC miralax as needed 5. Anemia: Secondary chemotherapy, patient is approximately at her baseline. Monitor.  6. Hyponatremia: Mild, have  recommended patient decrease her fluid intake. 7. Edema: Continue Lasix as prescribed. She instructed to continue as needed with caution given her hyponatremia.  8. Weakness and fatigue: Also factorial including chemotherapy, malignancy, and advancing age.  Patient expressed understanding and was in agreement with this plan. She also understands that She can call clinic at any time with any questions, concerns, or complaints.   Pancreatic cancer metastasized to liver   Staging form: Pancreas, AJCC 7th Edition     Clinical stage from 03/29/2015: Stage IV ([place], N0, M1) - Signed by Lloyd Huger, MD on 03/29/2015   Mayra Reel, NP   12/04/2015 1:28 PM   Patient seen and evaluated independently and I agree with the assessment and plan as dictated above.

## 2015-12-05 LAB — CANCER ANTIGEN 19-9: CA 19-9: 3575 U/mL — ABNORMAL HIGH (ref 0–35)

## 2015-12-11 ENCOUNTER — Encounter
Admission: RE | Admit: 2015-12-11 | Discharge: 2015-12-11 | Disposition: A | Discharge status: Home / Self Care | Payer: Medicare Other | Source: Ambulatory Visit | Attending: Oncology | Admitting: Oncology

## 2015-12-11 DIAGNOSIS — C259 Malignant neoplasm of pancreas, unspecified: Secondary | ICD-10-CM

## 2015-12-11 DIAGNOSIS — C787 Secondary malignant neoplasm of liver and intrahepatic bile duct: Secondary | ICD-10-CM | POA: Diagnosis present

## 2015-12-11 LAB — GLUCOSE, CAPILLARY: GLUCOSE-CAPILLARY: 111 mg/dL — AB (ref 65–99)

## 2015-12-11 MED ORDER — FLUDEOXYGLUCOSE F - 18 (FDG) INJECTION
11.6300 | Freq: Once | INTRAVENOUS | Status: AC | PRN
  Administered 2015-12-11: 11.63 via INTRAVENOUS

## 2015-12-13 ENCOUNTER — Other Ambulatory Visit: Payer: Medicare Other

## 2015-12-18 ENCOUNTER — Inpatient Hospital Stay: Payer: Medicare Other

## 2015-12-18 ENCOUNTER — Other Ambulatory Visit: Payer: Medicare Other

## 2015-12-18 ENCOUNTER — Inpatient Hospital Stay (HOSPITAL_BASED_OUTPATIENT_CLINIC_OR_DEPARTMENT_OTHER): Payer: Medicare Other | Admitting: Oncology

## 2015-12-18 ENCOUNTER — Ambulatory Visit: Payer: Medicare Other | Admitting: Oncology

## 2015-12-18 VITALS — BP 102/66 | HR 66 | Temp 97.4°F | Resp 18 | Wt 111.0 lb

## 2015-12-18 DIAGNOSIS — R600 Localized edema: Secondary | ICD-10-CM

## 2015-12-18 DIAGNOSIS — R531 Weakness: Secondary | ICD-10-CM

## 2015-12-18 DIAGNOSIS — C259 Malignant neoplasm of pancreas, unspecified: Secondary | ICD-10-CM | POA: Diagnosis not present

## 2015-12-18 DIAGNOSIS — K5900 Constipation, unspecified: Secondary | ICD-10-CM

## 2015-12-18 DIAGNOSIS — R5381 Other malaise: Secondary | ICD-10-CM

## 2015-12-18 DIAGNOSIS — D6481 Anemia due to antineoplastic chemotherapy: Secondary | ICD-10-CM | POA: Diagnosis not present

## 2015-12-18 DIAGNOSIS — C787 Secondary malignant neoplasm of liver and intrahepatic bile duct: Principal | ICD-10-CM

## 2015-12-18 DIAGNOSIS — T451X5S Adverse effect of antineoplastic and immunosuppressive drugs, sequela: Secondary | ICD-10-CM

## 2015-12-18 DIAGNOSIS — Z7982 Long term (current) use of aspirin: Secondary | ICD-10-CM

## 2015-12-18 DIAGNOSIS — E871 Hypo-osmolality and hyponatremia: Secondary | ICD-10-CM

## 2015-12-18 DIAGNOSIS — E1165 Type 2 diabetes mellitus with hyperglycemia: Secondary | ICD-10-CM

## 2015-12-18 DIAGNOSIS — Z5111 Encounter for antineoplastic chemotherapy: Secondary | ICD-10-CM | POA: Diagnosis not present

## 2015-12-18 DIAGNOSIS — R5383 Other fatigue: Secondary | ICD-10-CM

## 2015-12-18 LAB — COMPREHENSIVE METABOLIC PANEL
ALT: 13 U/L — ABNORMAL LOW (ref 14–54)
ANION GAP: 5 (ref 5–15)
AST: 18 U/L (ref 15–41)
Albumin: 3.4 g/dL — ABNORMAL LOW (ref 3.5–5.0)
Alkaline Phosphatase: 79 U/L (ref 38–126)
BILIRUBIN TOTAL: 0.4 mg/dL (ref 0.3–1.2)
BUN: 21 mg/dL — ABNORMAL HIGH (ref 6–20)
CALCIUM: 8.5 mg/dL — AB (ref 8.9–10.3)
CHLORIDE: 97 mmol/L — AB (ref 101–111)
CO2: 29 mmol/L (ref 22–32)
CREATININE: 0.63 mg/dL (ref 0.44–1.00)
GLUCOSE: 198 mg/dL — AB (ref 65–99)
POTASSIUM: 3.8 mmol/L (ref 3.5–5.1)
Sodium: 131 mmol/L — ABNORMAL LOW (ref 135–145)
Total Protein: 6.7 g/dL (ref 6.5–8.1)

## 2015-12-18 LAB — CBC WITH DIFFERENTIAL/PLATELET
Basophils Absolute: 0 10*3/uL (ref 0–0.1)
Basophils Relative: 1 %
EOS PCT: 3 %
Eosinophils Absolute: 0.1 10*3/uL (ref 0–0.7)
HEMATOCRIT: 33.6 % — AB (ref 35.0–47.0)
Hemoglobin: 11.1 g/dL — ABNORMAL LOW (ref 12.0–16.0)
LYMPHS ABS: 0.9 10*3/uL — AB (ref 1.0–3.6)
Lymphocytes Relative: 28 %
MCH: 29.9 pg (ref 26.0–34.0)
MCHC: 33 g/dL (ref 32.0–36.0)
MCV: 90.8 fL (ref 80.0–100.0)
MONOS PCT: 16 %
Monocytes Absolute: 0.5 10*3/uL (ref 0.2–0.9)
Neutro Abs: 1.8 10*3/uL (ref 1.4–6.5)
Neutrophils Relative %: 52 %
Platelets: 267 10*3/uL (ref 150–440)
RBC: 3.69 MIL/uL — ABNORMAL LOW (ref 3.80–5.20)
RDW: 16.2 % — AB (ref 11.5–14.5)
WBC: 3.3 10*3/uL — ABNORMAL LOW (ref 3.6–11.0)

## 2015-12-18 MED ORDER — HEPARIN SOD (PORK) LOCK FLUSH 100 UNIT/ML IV SOLN
500.0000 [IU] | Freq: Once | INTRAVENOUS | Status: AC | PRN
  Administered 2015-12-18: 500 [IU]

## 2015-12-18 MED ORDER — SODIUM CHLORIDE 0.9 % IJ SOLN
10.0000 mL | INTRAMUSCULAR | Status: DC | PRN
  Administered 2015-12-18: 10 mL
  Filled 2015-12-18: qty 10

## 2015-12-18 MED ORDER — HEPARIN SOD (PORK) LOCK FLUSH 100 UNIT/ML IV SOLN
INTRAVENOUS | Status: AC
  Filled 2015-12-18: qty 5

## 2015-12-20 NOTE — Progress Notes (Signed)
[place]  Telephone:([phone]) [phone] Fax:(336) [phone]  ID: Amy Mckenzie OB: 14-Oct-1921  MR#: [id number]  [id number]  Patient Care Team: Lloyd Huger, MD as PCP - General (Oncology)  CHIEF COMPLAINT:  Chief Complaint  Patient presents with  . Follow-up    results from testing    INTERVAL HISTORY: Patient returns to clinic today for further evaluation and discussion of her PET scan results. She continues to complain of weakness and fatigue that is unchanged. Her peripheral edema is unchanged. She has no neurologic complaints. She denies any recent fevers or illnesses. She denies any weight loss. She has no chest pain or shortness of breath. She denies any abdominal pain. She denies any nausea, vomiting, or diarrhea. She does have occasional constipation. She has no urinary complaints. Patient offers no further specific complaints today.  REVIEW OF SYSTEMS:   Review of Systems  Constitutional: Positive for malaise/fatigue. Negative for fever.  Eyes: Negative.   Respiratory: Negative.   Cardiovascular: Positive for leg swelling.  Gastrointestinal: Positive for constipation. Negative for nausea, vomiting, abdominal pain and diarrhea.  Musculoskeletal: Negative.   Neurological: Positive for weakness.    As per HPI. Otherwise, a complete review of systems is negatve.  PAST MEDICAL HISTORY: Past Medical History  Diagnosis Date  . Hypothyroidism   . Skin cancer   . Pancreatic cancer ([place])   . GERD (gastroesophageal reflux disease)   . Diabetes mellitus without complication ([place])   . Osteoporosis     PAST SURGICAL HISTORY: Past Surgical History  Procedure Laterality Date  . Cholecystectomy      FAMILY HISTORY: Reviewed and unchanged. No reported history of chronic disease or malignancy.     ADVANCED DIRECTIVES:    HEALTH MAINTENANCE: Social History  Substance Use Topics  . Smoking status: Never Smoker   . Smokeless tobacco: Never Used    . Alcohol Use: Yes     Comment: occasional     Colonoscopy:  PAP:  Bone density:  Lipid panel:  No Known Allergies  Current Outpatient Prescriptions  Medication Sig Dispense Refill  . ALPRAZolam (XANAX) 0.25 MG tablet   0  . aspirin 81 MG tablet Take 81 mg by mouth daily.    . furosemide (LASIX) 40 MG tablet Take 1 tablet (40 mg total) by mouth daily. 30 tablet 5  . levothyroxine (SYNTHROID, LEVOTHROID) 25 MCG tablet   3  . lidocaine-prilocaine (EMLA) cream     . polyethylene glycol powder (GLYCOLAX/MIRALAX) powder Take 1 Container by mouth once as needed.    . propranolol (INDERAL) 40 MG tablet Take 40 mg by mouth 2 (two) times daily.    . raloxifene (EVISTA) 60 MG tablet Take 60 mg by mouth daily.    . prochlorperazine (COMPAZINE) 10 MG tablet Reported on 12/18/2015  0   No current facility-administered medications for this visit.    OBJECTIVE: Filed Vitals:   12/18/15 0907  BP: 102/66  Pulse: 66  Temp: 97.4 F (36.3 C)  Resp: 18     Body mass index is 20.3 kg/(m^2).    ECOG FS:1 - Symptomatic but completely ambulatory  General: Well-developed, well-nourished, no acute distress. Eyes: anicteric sclera. Lungs: Clear to auscultation bilaterally. Heart: Regular rate and rhythm. No rubs, murmurs, or gallops. Abdomen: Soft, nontender, nondistended. No organomegaly noted, normoactive bowel sounds. Musculoskeletal: 1-2+ edema.  Neuro: Alert, answering all questions appropriately. Cranial nerves grossly intact. Skin: No rashes or petechiae noted. Psych: Normal affect.   LAB RESULTS:  Lab Results  Component Value Date   NA 131* 12/18/2015   K 3.8 12/18/2015   CL 97* 12/18/2015   CO2 29 12/18/2015   GLUCOSE 198* 12/18/2015   BUN 21* 12/18/2015   CREATININE 0.63 12/18/2015   CALCIUM 8.5* 12/18/2015   PROT 6.7 12/18/2015   ALBUMIN 3.4* 12/18/2015   AST 18 12/18/2015   ALT 13* 12/18/2015   ALKPHOS 79 12/18/2015   BILITOT 0.4 12/18/2015   GFRNONAA >60 12/18/2015    GFRAA >60 12/18/2015    Lab Results  Component Value Date   WBC 3.3* 12/18/2015   NEUTROABS 1.8 12/18/2015   HGB 11.1* 12/18/2015   HCT 33.6* 12/18/2015   MCV 90.8 12/18/2015   PLT 267 12/18/2015   Lab Results  Component Value Date   CA199 3575* 12/04/2015    STUDIES: Nm Pet Image Restag (ps) Skull Base To Thigh  12/11/2015  CLINICAL DATA:  Subsequent treatment strategy for pancreatic cancer metastatic to the liver. EXAM: NUCLEAR MEDICINE PET SKULL BASE TO THIGH TECHNIQUE: 11.63 mCi F-18 FDG was injected intravenously. Full-ring PET imaging was performed from the skull base to thigh after the radiotracer. CT data was obtained and used for attenuation correction and anatomic localization. FASTING BLOOD GLUCOSE:  Value: 111 mg/dl COMPARISON:  PET-CT 09/25/2015 and 05/27/2015. FINDINGS: NECK No hypermetabolic cervical lymph nodes are identified.There are no lesions of the pharyngeal mucosal space. There is a stable 1.6 cm low-density right thyroid lesion on image 50 without associated abnormal metabolic activity. CHEST There are no hypermetabolic mediastinal, hilar or axillary lymph nodes. Previously noted mediastinal and hilar lymph nodes now demonstrate normal metabolic activity similar to blood pool. There is no suspicious pulmonary activity. There is stable scarring at the right apex and diffuse atherosclerosis. ABDOMEN/PELVIS There is a new small hypermetabolic lesion peripherally in the right hepatic lobe. The lesion is not well seen on the CT images, measuring approximately 12 mm on image 129. It has an SUV max of 4.1. There is no other suspicious liver lesion. There is a stable cyst in the left hepatic lobe. Hypermetabolic activity is again noted along the biliary stent, not significantly changed compared with the prior study. Along the superior aspect of the stent, this has an SUV max of 5.1 (same previously). Low-level activity along the distal end of the stent within the pancreatic head  is also similar to the prior study with an SUV max of 5.1. The stent remains patent with persistent pneumobilia. There is no hypermetabolic nodal activity. Stable incidental findings including previous cholecystectomy, atherosclerosis, uterine fibroids and mild generalized soft tissue edema. SKELETON There is no hypermetabolic activity to suggest osseous metastatic disease. IMPRESSION: 1. New small hypermetabolic lesion peripherally in the right hepatic lobe suspicious for metastatic disease. 2. The activity within the pancreatic head and along the biliary stent has not significantly changed. This activity is nonspecific and may be inflammatory, associated with the stent. The stent remains patent with persistent pneumobilia. 3. No other evidence of metastatic disease. Electronically Signed   By: Richardean Sale M.D.   On: 12/11/2015 11:31    ASSESSMENT: Stage IV pancreatic cancer with liver metastasis.  PLAN:    1. Pancreatic cancer:  PET scan results reviewed independently and reported as above with progression of disease. Her CA-19-9 also continues to trend up.   After lengthy discussion with the patient and her daughter, she does not wish to pursue any further palliative chemotherapy. She does not want to enroll in hospice at this point either, but will  call clinic in the future if she feels it is necessary. No follow-up has been scheduled. 2. Hyperbilirubinemia: Bilirubin is now within normal limits after stenting and ERCP. Monitor. 3. Hyperglycemia: Monitor. Continue current medications. 4. Constipation: Continue OTC miralax as needed 5. Anemia: Secondary chemotherapy, patient is approximately at her baseline. Monitor.  6. Hyponatremia: Mild, have recommended patient decrease her fluid intake. 7. Edema: Continue Lasix as prescribed. She instructed to continue as needed with caution given her hyponatremia.  8. Weakness and fatigue: Also factorial including chemotherapy, malignancy, and advancing  age.   Patient is DNR/DNI.  Patient expressed understanding and was in agreement with this plan. She also understands that She can call clinic at any time with any questions, concerns, or complaints.   Approximately 30 minutes was spent in discussion of which greater than 50% was consultation.  Pancreatic cancer metastasized to liver   Staging form: Pancreas, AJCC 7th Edition     Clinical stage from 03/29/2015: Stage IV (TX, N0, M1) - Signed by Lloyd Huger, MD on 03/29/2015   Lloyd Huger, MD   12/20/2015 2:31 PM

## 2016-01-13 ENCOUNTER — Emergency Department: Payer: Medicare Other

## 2016-01-13 ENCOUNTER — Inpatient Hospital Stay
Admission: EM | Admit: 2016-01-13 | Discharge: 2016-01-29 | DRG: 377 | Disposition: E | Payer: Medicare Other | Attending: Internal Medicine | Admitting: Internal Medicine

## 2016-01-13 ENCOUNTER — Encounter: Payer: Self-pay | Admitting: Emergency Medicine

## 2016-01-13 ENCOUNTER — Inpatient Hospital Stay: Payer: Medicare Other

## 2016-01-13 ENCOUNTER — Telehealth: Payer: Self-pay | Admitting: *Deleted

## 2016-01-13 DIAGNOSIS — Z7982 Long term (current) use of aspirin: Secondary | ICD-10-CM | POA: Diagnosis not present

## 2016-01-13 DIAGNOSIS — K831 Obstruction of bile duct: Secondary | ICD-10-CM | POA: Diagnosis present

## 2016-01-13 DIAGNOSIS — C259 Malignant neoplasm of pancreas, unspecified: Secondary | ICD-10-CM | POA: Diagnosis present

## 2016-01-13 DIAGNOSIS — R55 Syncope and collapse: Secondary | ICD-10-CM | POA: Diagnosis present

## 2016-01-13 DIAGNOSIS — K922 Gastrointestinal hemorrhage, unspecified: Secondary | ICD-10-CM | POA: Diagnosis not present

## 2016-01-13 DIAGNOSIS — K2981 Duodenitis with bleeding: Principal | ICD-10-CM | POA: Diagnosis present

## 2016-01-13 DIAGNOSIS — K449 Diaphragmatic hernia without obstruction or gangrene: Secondary | ICD-10-CM | POA: Diagnosis present

## 2016-01-13 DIAGNOSIS — E039 Hypothyroidism, unspecified: Secondary | ICD-10-CM | POA: Diagnosis present

## 2016-01-13 DIAGNOSIS — K2960 Other gastritis without bleeding: Secondary | ICD-10-CM | POA: Diagnosis present

## 2016-01-13 DIAGNOSIS — G893 Neoplasm related pain (acute) (chronic): Secondary | ICD-10-CM | POA: Diagnosis present

## 2016-01-13 DIAGNOSIS — Z79899 Other long term (current) drug therapy: Secondary | ICD-10-CM | POA: Diagnosis not present

## 2016-01-13 DIAGNOSIS — Z833 Family history of diabetes mellitus: Secondary | ICD-10-CM | POA: Diagnosis not present

## 2016-01-13 DIAGNOSIS — Z9221 Personal history of antineoplastic chemotherapy: Secondary | ICD-10-CM

## 2016-01-13 DIAGNOSIS — Z66 Do not resuscitate: Secondary | ICD-10-CM | POA: Diagnosis present

## 2016-01-13 DIAGNOSIS — D62 Acute posthemorrhagic anemia: Secondary | ICD-10-CM | POA: Diagnosis present

## 2016-01-13 DIAGNOSIS — M810 Age-related osteoporosis without current pathological fracture: Secondary | ICD-10-CM | POA: Diagnosis present

## 2016-01-13 DIAGNOSIS — C787 Secondary malignant neoplasm of liver and intrahepatic bile duct: Secondary | ICD-10-CM | POA: Diagnosis present

## 2016-01-13 DIAGNOSIS — Z85828 Personal history of other malignant neoplasm of skin: Secondary | ICD-10-CM

## 2016-01-13 DIAGNOSIS — Z515 Encounter for palliative care: Secondary | ICD-10-CM | POA: Diagnosis not present

## 2016-01-13 DIAGNOSIS — A419 Sepsis, unspecified organism: Secondary | ICD-10-CM | POA: Diagnosis present

## 2016-01-13 DIAGNOSIS — D649 Anemia, unspecified: Secondary | ICD-10-CM

## 2016-01-13 DIAGNOSIS — K2210 Ulcer of esophagus without bleeding: Secondary | ICD-10-CM | POA: Diagnosis present

## 2016-01-13 DIAGNOSIS — E119 Type 2 diabetes mellitus without complications: Secondary | ICD-10-CM | POA: Diagnosis present

## 2016-01-13 DIAGNOSIS — K219 Gastro-esophageal reflux disease without esophagitis: Secondary | ICD-10-CM | POA: Diagnosis present

## 2016-01-13 DIAGNOSIS — Y731 Therapeutic (nonsurgical) and rehabilitative gastroenterology and urology devices associated with adverse incidents: Secondary | ICD-10-CM | POA: Diagnosis present

## 2016-01-13 DIAGNOSIS — I959 Hypotension, unspecified: Secondary | ICD-10-CM | POA: Diagnosis not present

## 2016-01-13 DIAGNOSIS — N390 Urinary tract infection, site not specified: Secondary | ICD-10-CM | POA: Diagnosis present

## 2016-01-13 DIAGNOSIS — R17 Unspecified jaundice: Secondary | ICD-10-CM | POA: Diagnosis not present

## 2016-01-13 LAB — HEPATIC FUNCTION PANEL
ALK PHOS: 276 U/L — AB (ref 38–126)
ALT: 200 U/L — AB (ref 14–54)
AST: 216 U/L — ABNORMAL HIGH (ref 15–41)
Albumin: 2.8 g/dL — ABNORMAL LOW (ref 3.5–5.0)
BILIRUBIN DIRECT: 3.1 mg/dL — AB (ref 0.1–0.5)
BILIRUBIN INDIRECT: 1.4 mg/dL — AB (ref 0.3–0.9)
TOTAL PROTEIN: 5.7 g/dL — AB (ref 6.5–8.1)
Total Bilirubin: 4.5 mg/dL — ABNORMAL HIGH (ref 0.3–1.2)

## 2016-01-13 LAB — GLUCOSE, CAPILLARY
Glucose-Capillary: 143 mg/dL — ABNORMAL HIGH (ref 65–99)
Glucose-Capillary: 164 mg/dL — ABNORMAL HIGH (ref 65–99)

## 2016-01-13 LAB — CBC
HEMATOCRIT: 18.4 % — AB (ref 35.0–47.0)
HEMOGLOBIN: 5.9 g/dL — AB (ref 12.0–16.0)
MCH: 29.4 pg (ref 26.0–34.0)
MCHC: 32.4 g/dL (ref 32.0–36.0)
MCV: 90.9 fL (ref 80.0–100.0)
Platelets: 159 10*3/uL (ref 150–440)
RBC: 2.02 MIL/uL — AB (ref 3.80–5.20)
RDW: 16 % — ABNORMAL HIGH (ref 11.5–14.5)
WBC: 11.6 10*3/uL — AB (ref 3.6–11.0)

## 2016-01-13 LAB — BASIC METABOLIC PANEL
ANION GAP: 5 (ref 5–15)
BUN: 29 mg/dL — ABNORMAL HIGH (ref 6–20)
CALCIUM: 7.7 mg/dL — AB (ref 8.9–10.3)
CO2: 24 mmol/L (ref 22–32)
Chloride: 108 mmol/L (ref 101–111)
Creatinine, Ser: 0.61 mg/dL (ref 0.44–1.00)
GLUCOSE: 171 mg/dL — AB (ref 65–99)
POTASSIUM: 4.1 mmol/L (ref 3.5–5.1)
Sodium: 137 mmol/L (ref 135–145)

## 2016-01-13 LAB — CBC WITH DIFFERENTIAL/PLATELET
Basophils Absolute: 0 10*3/uL (ref 0–0.1)
Eosinophils Absolute: 0 10*3/uL (ref 0–0.7)
Eosinophils Relative: 0 %
HEMATOCRIT: 32.2 % — AB (ref 35.0–47.0)
HEMOGLOBIN: 10.7 g/dL — AB (ref 12.0–16.0)
Lymphocytes Relative: 3 %
Lymphs Abs: 0.5 10*3/uL — ABNORMAL LOW (ref 1.0–3.6)
MCH: 29.9 pg (ref 26.0–34.0)
MCHC: 33.3 g/dL (ref 32.0–36.0)
MCV: 89.9 fL (ref 80.0–100.0)
Monocytes Absolute: 0.6 10*3/uL (ref 0.2–0.9)
NEUTROS ABS: 13 10*3/uL — AB (ref 1.4–6.5)
Platelets: 173 10*3/uL (ref 150–440)
RBC: 3.58 MIL/uL — AB (ref 3.80–5.20)
RDW: 15.1 % — ABNORMAL HIGH (ref 11.5–14.5)
WBC: 14.1 10*3/uL — AB (ref 3.6–11.0)

## 2016-01-13 LAB — URINALYSIS COMPLETE WITH MICROSCOPIC (ARMC ONLY)
BACTERIA UA: NONE SEEN
GLUCOSE, UA: NEGATIVE mg/dL
NITRITE: NEGATIVE
PH: 5 (ref 5.0–8.0)
Protein, ur: 30 mg/dL — AB
SPECIFIC GRAVITY, URINE: 1.031 — AB (ref 1.005–1.030)

## 2016-01-13 LAB — LACTIC ACID, PLASMA
LACTIC ACID, VENOUS: 2 mmol/L (ref 0.5–2.0)
Lactic Acid, Venous: 2.6 mmol/L (ref 0.5–2.0)

## 2016-01-13 LAB — TROPONIN I: TROPONIN I: 0.03 ng/mL (ref <0.031)

## 2016-01-13 LAB — ABO/RH: ABO/RH(D): A POS

## 2016-01-13 LAB — PREPARE RBC (CROSSMATCH)

## 2016-01-13 LAB — PROTIME-INR
INR: 1.09
Prothrombin Time: 14.3 s (ref 11.4–15.0)

## 2016-01-13 MED ORDER — INSULIN ASPART 100 UNIT/ML ~~LOC~~ SOLN: 0.0000 [IU] | Freq: Every day | SUBCUTANEOUS | Status: DC

## 2016-01-13 MED ORDER — TECHNETIUM TC 99M-LABELED RED BLOOD CELLS IV KIT
22.0400 | PACK | Freq: Once | INTRAVENOUS | Status: AC | PRN
  Administered 2016-01-13: 22.03 via INTRAVENOUS

## 2016-01-13 MED ORDER — SODIUM CHLORIDE 0.9 % IV SOLN
8.0000 mg/h | INTRAVENOUS | Status: DC
  Administered 2016-01-13 – 2016-01-14 (×4): 8 mg/h via INTRAVENOUS
  Filled 2016-01-13 (×4): qty 80

## 2016-01-13 MED ORDER — LEVOTHYROXINE SODIUM 25 MCG PO TABS
25.0000 ug | ORAL_TABLET | ORAL | Status: DC
  Administered 2016-01-14: 25 ug via ORAL
  Filled 2016-01-13: qty 1

## 2016-01-13 MED ORDER — SODIUM CHLORIDE 0.9 % IV SOLN
80.0000 mg | Freq: Once | INTRAVENOUS | Status: DC
  Filled 2016-01-13 (×2): qty 80

## 2016-01-13 MED ORDER — SODIUM CHLORIDE 0.9% FLUSH
3.0000 mL | Freq: Two times a day (BID) | INTRAVENOUS | Status: DC
  Administered 2016-01-14 (×2): 3 mL via INTRAVENOUS

## 2016-01-13 MED ORDER — LEVOTHYROXINE SODIUM 50 MCG PO TABS: 50.0000 ug | ORAL_TABLET | ORAL | Status: DC

## 2016-01-13 MED ORDER — SODIUM CHLORIDE 0.9 % IV SOLN
10.0000 mL/h | Freq: Once | INTRAVENOUS | Status: AC
  Administered 2016-01-13: 10 mL/h via INTRAVENOUS

## 2016-01-13 MED ORDER — ACETAMINOPHEN 500 MG PO TABS
500.0000 mg | ORAL_TABLET | Freq: Four times a day (QID) | ORAL | Status: DC | PRN
  Administered 2016-01-13 – 2016-01-14 (×2): 500 mg via ORAL
  Filled 2016-01-13 (×2): qty 1

## 2016-01-13 MED ORDER — INSULIN ASPART 100 UNIT/ML ~~LOC~~ SOLN: 0.0000 [IU] | Freq: Three times a day (TID) | SUBCUTANEOUS | Status: DC

## 2016-01-13 MED ORDER — PANTOPRAZOLE SODIUM 40 MG IV SOLR
40.0000 mg | Freq: Two times a day (BID) | INTRAVENOUS | Status: DC
  Administered 2016-01-13: 40 mg via INTRAVENOUS

## 2016-01-13 MED ORDER — SODIUM CHLORIDE 0.9 % IV BOLUS (SEPSIS)
1000.0000 mL | Freq: Once | INTRAVENOUS | Status: AC
  Administered 2016-01-13: 1000 mL via INTRAVENOUS

## 2016-01-13 MED ORDER — OXYCODONE HCL 5 MG PO TABS
5.0000 mg | ORAL_TABLET | Freq: Four times a day (QID) | ORAL | Status: DC | PRN
  Administered 2016-01-14: 5 mg via ORAL
  Filled 2016-01-13: qty 1

## 2016-01-13 MED ORDER — ONDANSETRON HCL 4 MG PO TABS: 4.0000 mg | ORAL_TABLET | Freq: Four times a day (QID) | ORAL | Status: DC | PRN

## 2016-01-13 MED ORDER — CETYLPYRIDINIUM CHLORIDE 0.05 % MT LIQD
7.0000 mL | Freq: Two times a day (BID) | OROMUCOSAL | Status: DC
  Administered 2016-01-14: 7 mL via OROMUCOSAL

## 2016-01-13 MED ORDER — ONDANSETRON HCL 4 MG/2ML IJ SOLN: 4.0000 mg | Freq: Four times a day (QID) | INTRAMUSCULAR | Status: DC | PRN

## 2016-01-13 NOTE — ED Notes (Signed)
Dr. Mariea Clonts notified of lactic acid 2.6

## 2016-01-13 NOTE — Telephone Encounter (Signed)
Called to report that she is considering taking her mother to ER due to her passing out and the fact that she is passing bloody stools more that what hemorrhoids would bleed. She is also very pale. I advised her to take her to ER, she states she may have to call EMS to get her there if she cannot find someone to help her get her into the car. She then asked if ER would call Dr Grayland Ormond for advice. I explained that they would evaluate her and call if needed.

## 2016-01-13 NOTE — ED Notes (Signed)
Called to pt room by pt daughter. Pt found pale and unresponsive. Pt carotid pulse palpable. Dr. Mariea Clonts at bedside. Pt placed in trendelenburg. Pt became responsive. Verbal consent obtained from pt daughter and POA for blood administration.

## 2016-01-13 NOTE — Consult Note (Signed)
Repeat hemogram and PT showing hgb  To be 10.7 from 5.9 early this afternoon despite only 2 units of prbc.   PT 1.09.  Hemodynamically stable. No repeat bm since admission,  or any emesis.  Discussed with family.  Will plan for first case in am.

## 2016-01-13 NOTE — Progress Notes (Signed)
Radiologist called and notified that her bleeding scan is positive, patient is bleeding from the duodenum. Discussed with Dr. Earley Brooke on-call gastroenterologist, who has discussed the diagnosis with patient's daughter and going to get EGD done as soon as possible. Not considering vascular surgery consult at this time Appreciate GI recommendations

## 2016-01-13 NOTE — ED Notes (Signed)
Pt arrived via EMS from home with reports of syncope and bloody stools this morning. Pt states got up to bathroom and passed out, pt reports awakening on bathroom floor and she got up to have bowel movement. Pt states noticed blood in her stools.

## 2016-01-13 NOTE — ED Notes (Signed)
Patient transported to nuclear medicine 

## 2016-01-13 NOTE — Progress Notes (Signed)
Patient on floor from ED. Tele box on and verified with [place]. Vital signs stable. Skin verified with [place]. Pain at a 7 noted in epigastric region patient refused pain medication. Resting quietly in bed with bed in the lowest position and call light in reach with bed alarm on. Will continue to monitor.

## 2016-01-13 NOTE — Consult Note (Signed)
Please see full GI consult by Ms Constance Haw.  Patient is a 82 y/o f admitted with melena.  History of pancreatic cancer and metal pancreatic stent placed by ERCP 02/05/15.  Admitted with syncopal episode and hematochezia.  Bleeding scan positive for possible source in the 3rd portion of the duodenum.  Patietn had hgb of 5.9 on admission and has received 2 u prbc.  Awaiting recheck of hgb and PT, EGD when possible, currently 2 cases in OR.  Will transfuse as needed. From location of positive bleeding on scan, concern for duodenal ulcer, possible associated with erosion from pancreatic stent.  Last bm at 10:30 this am. Discussed with patient and patients daughter.

## 2016-01-13 NOTE — H&P (Signed)
[facility] [place] NAME: Caleigh Bellefeuille    MR#:  [id number]  DATE OF BIRTH:  Nov 05, 1921  DATE OF ADMISSION:  01/05/2016  PRIMARY CARE PHYSICIAN: Lloyd Huger, MD   REQUESTING/REFERRING PHYSICIAN: Mariea Clonts  CHIEF COMPLAINT:  Loss of consciousness and rectal bleeding  HISTORY OF PRESENT ILLNESS:  Amy Mckenzie  is a 82 y.o. female with a known history of pancreatic cancer with liver metastases, not considering to pursue chemotherapy treatments anymore and stopped treatments in January with history of diabetes mellitus, GERD is presenting to the ED with a chief complaint of rectal bleeding. Patient has noticed one episode of hematochezia on Saturday after that that no other episodes were noticed. Today patient had an other episode of hematochezia with maroon blood in the stool followed by brief syncopal episode. Patient actually stood up this morning and had a syncopal episode striking her right cheek on the ground. CT head is negative in the ED. Hemoglobin is at 5.9. Patient was initially lethargic but subsequently after giving 2 units of blood and the fluid boluses she is mentating fine. During my examination she is answering all questions complaining of epigastric abdominal pain. Denies any other complains. Sees Dr. Vira Agar as an outpatient  PAST MEDICAL HISTORY:   Past Medical History  Diagnosis Date  . Hypothyroidism   . Skin cancer   . Pancreatic cancer ([place])   . GERD (gastroesophageal reflux disease)   . Osteoporosis   . Diabetes mellitus without complication ([place])     PAST SURGICAL HISTOIRY:   Past Surgical History  Procedure Laterality Date  . Cholecystectomy     Port-A-Cath, and pancreatic stent  SOCIAL HISTORY:   Social History  Substance Use Topics  . Smoking status: Never Smoker   . Smokeless tobacco: Never Used  . Alcohol Use: Yes     Comment: occasional    FAMILY HISTORY:  Diabetes mellitus runs in her  family  DRUG ALLERGIES:  No Known Allergies  REVIEW OF SYSTEMS:  CONSTITUTIONAL: No fever, reporting fatigue Arneta Cliche.  EYES: No blurred or double vision.  EARS, NOSE, AND THROAT: No tinnitus or ear pain.  RESPIRATORY: No cough, shortness of breath, wheezing or hemoptysis.  CARDIOVASCULAR: No chest pain, orthopnea, edema.  GASTROINTESTINAL: No nausea, vomiting, reporting maroon colored stool and epigastric abdominal pain GENITOURINARY: No dysuria, hematuria.  ENDOCRINE: No polyuria, nocturia,  HEMATOLOGY: No anemia, easy bruising or bleeding SKIN: No rash or lesion. MUSCULOSKELETAL: No joint pain or arthritis.   NEUROLOGIC: No tingling, numbness, weakness.  PSYCHIATRY: No anxiety or depression.   MEDICATIONS AT HOME:   Prior to Admission medications   Medication Sig Start Date End Date Taking? Authorizing Provider  acetaminophen (TYLENOL) 500 MG tablet Take 500 mg by mouth every 6 (six) hours as needed for mild pain, fever or headache.   Yes Historical Provider, MD  ALPRAZolam Duanne Moron) 0.25 MG tablet Take 0.25 mg by mouth at bedtime as needed for sleep.    Yes Historical Provider, MD  furosemide (LASIX) 40 MG tablet Take 1 tablet (40 mg total) by mouth daily. Patient taking differently: Take 40 mg by mouth daily as needed for edema.  08/22/15  Yes Lloyd Huger, MD  levothyroxine (SYNTHROID, LEVOTHROID) 25 MCG tablet Take 25 mcg by mouth every other day.    Yes Historical Provider, MD  levothyroxine (SYNTHROID, LEVOTHROID) 50 MCG tablet Take 50 mcg by mouth every other day.   Yes Historical Provider, MD  polyethylene  glycol (MIRALAX / GLYCOLAX) packet Take 17 g by mouth daily as needed for mild constipation.   Yes Historical Provider, MD  propranolol (INDERAL) 40 MG tablet Take 40 mg by mouth 2 (two) times daily.   Yes Historical Provider, MD      VITAL SIGNS:  Blood pressure 127/49, pulse 68, temperature 97.8 F (36.6 C), temperature source Oral, resp. rate 14, height 5\' 2"   (1.575 m), weight 50.8 kg (111 lb 15.9 oz), SpO2 90 %.  PHYSICAL EXAMINATION:  GENERAL:  82 y.o.-year-old patient lying in the bed with no acute distress.  EYES: Pupils equal, round, reactive to light and accommodation. No scleral icterus. Extraocular muscles intact.  HEENT: Head atraumatic, normocephalic. Oropharynx and nasopharynx clear.  NECK:  Supple, no jugular venous distention. No thyroid enlargement, no tenderness.  LUNGS: Normal breath sounds bilaterally, no wheezing, rales,rhonchi or crepitation. No use of accessory muscles of respiration.  CARDIOVASCULAR: S1, S2 normal. No murmurs, rubs, or gallops.  ABDOMEN: Soft, tender epigastric area, no rebound tenderness, distended. Bowel sounds present.   EXTREMITIES: No pedal edema, cyanosis, or clubbing.  NEUROLOGIC: Cranial nerves II through XII are intact. Muscle strength 5/5 in all extremities. Sensation intact. Gait not checked.  PSYCHIATRIC: The patient is alert and oriented x 3.  SKIN: No obvious rash, lesion, or ulcer.   LABORATORY PANEL:   CBC  Recent Labs Lab 01/11/2016 1350  WBC 11.6*  HGB 5.9*  HCT 18.4*  PLT 159   ------------------------------------------------------------------------------------------------------------------  Chemistries   Recent Labs Lab 01/23/2016 1350  NA 137  K 4.1  CL 108  CO2 24  GLUCOSE 171*  BUN 29*  CREATININE 0.61  CALCIUM 7.7*   ------------------------------------------------------------------------------------------------------------------  Cardiac Enzymes  Recent Labs Lab 01/28/2016 1350  TROPONINI 0.03   ------------------------------------------------------------------------------------------------------------------  RADIOLOGY:  Dg Chest 1 View  01/23/2016  CLINICAL DATA:  Sepsis, pancreatic cancer, syncope EXAM: CHEST 1 VIEW COMPARISON:  12/11/2015 FINDINGS: Borderline cardiomegaly. No infiltrate or pleural effusion. No pulmonary edema. Mild elevation of the right  hemidiaphragm. IMPRESSION: No active disease. Electronically Signed   By: Lahoma Crocker M.D.   On: 01/08/2016 15:24   Ct Head Wo Contrast  01/17/2016  CLINICAL DATA:  Syncope, bloody stool this morning stool this morning EXAM: CT HEAD WITHOUT CONTRAST TECHNIQUE: Contiguous axial images were obtained from the base of the skull through the vertex without intravenous contrast. COMPARISON:  None. FINDINGS: No skull fracture is noted. Atherosclerotic calcifications of carotid siphon. Mild cerebral atrophy. No intracranial hemorrhage, mass effect or midline shift. Paranasal sinuses and mastoid air cells are unremarkable. Mild periventricular white matter decreased attenuation probable due to chronic small vessel ischemic changes no acute cortical infarction. No mass lesion is noted on this unenhanced scan. No intraventricular hemorrhage. IMPRESSION: No acute intracranial abnormality. Mild cerebral atrophy. Mild periventricular white matter decreased attenuation probable due to chronic small vessel ischemic changes. Atherosclerotic calcifications of carotid siphon. Electronically Signed   By: Lahoma Crocker M.D.   On: 01/05/2016 13:02    EKG:   Orders placed or performed during the hospital encounter of 01/26/2016  . ED EKG  . ED EKG  . EKG 12-Lead  . EKG 12-Lead    IMPRESSION AND PLAN:   1. Syncope secondary to symptomatic anemia from GI bleed with hemoglobin 5.9 Admit patient to telemetry Status post 2 units of blood transfusion and fluid boluses We will get hemoglobin and hematocrit every 6 hours and transfuse as needed Patient is DNR/DNI and not considering any procedures if  not needed Stat bleeding scan is ordered as recommended by Dr. Lindie Spruce is aware of the consult CT head is negative Continue Protonix drip  2. Pancreatic cancer with liver metastases Patient has discontinued her chemotherapy recently in January Status post pancreatic stent Provide pain management as needed basis  3.?  Sepsis- Meets septic criteria with leukocytosis and hypotension but patient doesn't look septic Hypotension is probably from symptomatically anemia, which is improved with IV fluids and blood transfusion Elevated white blood cell count is probably as patient stopped taking chemotherapy in January Patient is afebrile and doesn't look sick Blood cultures 2, urinalysis and chest x-ray were ordered but not considering antibiotics at this time. Continue close monitoring of the patient.  4. Chronic history of hypothyroidism continue Synthroid Patient takes 50 g and alternates with 25 g every other day will continue the same   5. Chronic history of diabetes mellitus not on any medications Monitor Accu-Cheks and provide sliding scale as well. Currently patient is nothing by mouth.    All the records are reviewed and case discussed with ED provider. Management plans discussed with the patient, family and they are in agreement.  CODE STATUS: DO NOT RESUSCITATE, daughter Ms. Lelon Frohlich is healthcare power of attorney  TOTAL CRITICAL CARE TIME TAKING CARE OF THIS PATIENT: 50 minutes.    Nicholes Mango M.D on 01/08/2016 at 3:40 PM  Between 7am to 6pm - Pager - [phone]  After 6pm go to [email or web address] - password [email or web address] [facility]  Office  [phone]  CC: Primary care physician; Lloyd Huger, MD

## 2016-01-13 NOTE — Consult Note (Signed)
GI Inpatient Consult Note  Reason for Consult: Dr. Margaretmary Eddy   Attending Requesting Consult: GI bleed  History of Present Illness: Amy Mckenzie is a 82 y.o. female seen for evaluation of GI bleed at the request of Dr. Margaretmary Eddy. She was last seen by [facility] in 03/2015 for jaundice. PMHx of known pancreatic mass in head. Had ERCP that admission w/ stent placment.   Daughter at bedside and helps w/ history. She reports a small amount of rectal bleeding 2 days ago that resolved spontaneously at that time. She had prior issues w/ constipation had took ex lax prior to onset of bleeding. Does also use miralax daily. She felt well yesterday until she had a large lunch, developed mild epigastric discomfort. No bleeding yesterday. No reported history of melena. This morning around 10am developed large amount of maroon colored stool with syncope. No further episodes of bleeding since in ER. She did have an additional episode of pre-syncope while in Er. She was feeling fairly well until 2 days ago. She denies chronic GI issues of dysphagia, N/V. Will occasionally use Tums if has night time GERD symptoms but this is infrequent. Appetite fair-weight actually up about #5 since cancer diagnosis. Denies melena/hematochezia prior to 2 days ago.   Stopped chemo several weeks ago due to worsening memory loss, leg swelling, etc.   No NSAIDs, No anti-coagulation.    Last Colonoscopy: around age 73 without polyps.  ERCP: 01/2015 with metal stent placement    Past Medical History:  Past Medical History  Diagnosis Date  . Hypothyroidism   . Skin cancer   . Pancreatic cancer ([place])   . GERD (gastroesophageal reflux disease)   . Osteoporosis   . Diabetes mellitus without complication [facility])     Problem List: Patient Active Problem List   Diagnosis Date Noted  . Syncope 01/15/2016  . Pancreatic cancer metastasized to liver [facility]) 03/29/2015    Past Surgical History: Past Surgical History  Procedure Laterality Date  .  Cholecystectomy      Allergies: No Known Allergies  Home Medications:  (Not in a hospital admission) Home medication reconciliation was completed with the patient.   Scheduled Inpatient Medications:   . [START ON 01/17/2016] pantoprazole (PROTONIX) IV  40 mg Intravenous Q12H    Continuous Inpatient Infusions:   . sodium chloride    . pantoprazole (PROTONIX) IV    . pantoprozole (PROTONIX) infusion 8 mg/hr (01/23/2016 1453)    PRN Inpatient Medications:    Family History: The patient's family history is negative for inflammatory bowel disorders, GI malignancy, or solid organ transplantation.  Social History:  The patient denies ETOH, tobacco, or drug use.   Review of Systems: Constitutional: Weight is stable.  Eyes: No changes in vision. ENT: No oral lesions, sore throat.  GI: see HPI.  Heme/Lymph: No easy bruising.  CV: No chest pain.  GU: No hematuria.  Integumentary: No rashes.  Neuro: No headaches.  Psych: No depression/anxiety.  Endocrine: No heat/cold intolerance.  Allergic/Immunologic: No urticaria.  Resp: No cough, SOB.  Musculoskeletal: No joint swelling.    Physical Examination: BP 127/49 mmHg  Pulse 68  Temp(Src) 97.8 F (36.6 C) (Oral)  Resp 14  Ht 5\' 2"  (1.575 m)  Wt 111 lb 15.9 oz (50.8 kg)  BMI 20.48 kg/m2  SpO2 90% Gen: NAD, alert and oriented x 4 HEENT: PEERLA, EOMI, Neck: supple, no JVD or thyromegaly Chest: CTA bilaterally, no wheezes, crackles, or other adventitious sounds CV: RRR, no m/g/c/r Abd: soft, ND, +  BS in all four quadrants; TTP in epigastric area without rebound or guarding Rectal - maroon stool in rectal vault. No fresh blood on rectal exam.  Ext: no edema, well perfused with 2+ pulses, Skin: no rash or lesions noted Lymph: no LAD  Data: Lab Results  Component Value Date   WBC 11.6* 01/21/2016   HGB 5.9* 01/01/2016   HCT 18.4* 01/26/2016   MCV 90.9 01/12/2016   PLT 159 01/15/2016    Recent Labs Lab 01/17/2016 1350   HGB 5.9*   Lab Results  Component Value Date   NA 137 01/25/2016   K 4.1 01/18/2016   CL 108 01/23/2016   CO2 24 01/27/2016   BUN 29* 01/25/2016   CREATININE 0.61 01/12/2016   Lab Results  Component Value Date   ALT 13* 12/18/2015   AST 18 12/18/2015   ALKPHOS 79 12/18/2015   BILITOT 0.4 12/18/2015   No results for input(s): APTT, INR, PTT in the last 168 hours. Assessment/Plan: Ms. [name] is a 82 y.o. female admitted for syncope and rectal bleeding.   Recommendations:  1. Anemia - severe 5 gram drop in Hgb in 4 weeks with maroon stool on exam. Consider AVM, diverticular, ischemic colitis. Has had 2 units PBRC with repeat H/H pending. Also has tagged RBC scan pending for further evaluation. Due to her history of liver mets will also check an INR.   2. Pancreatic cancer - s/p chemo x 1 year. Defers treatment at this time. LFTs pending.   *Case discussed w/ Dr. Gustavo Lah.   Thank you for the consult. Please call with questions or concerns.  Ronney Asters, PA-C [place]

## 2016-01-13 NOTE — ED Provider Notes (Signed)
[facility] Emergency Department Provider Note  ____________________________________________  Time seen: Approximately 1:45 PM  I have reviewed the triage vital signs and the nursing notes.   HISTORY  Chief Complaint Loss of Consciousness and Rectal Bleeding    HPI Amy Mckenzie is a 82 y.o. female currently declining treatment for pancreatic cancer, not anticoagulated, presenting with GI bleed and a syncopal episode. Patient and her daughter report that she had one episode of hematochezia 2 days ago. This morning she stood up and had a syncopal episode, striking her right cheek on the ground. She cannot recall the details of the event. Her daughter came to check on her and noticed one large episode of maroon stool.The patient also reports that she has been having some diffuse, mostly upper abdominal pain. No fever, chills, urinary symptoms. No changes in mental status.  The patient is DO NOT RESUSCITATE, DO NOT INTUBATE.   Past Medical History  Diagnosis Date  . Hypothyroidism   . Skin cancer   . Pancreatic cancer ([place])   . GERD (gastroesophageal reflux disease)   . Osteoporosis   . Diabetes mellitus without complication [facility])     Patient Active Problem List   Diagnosis Date Noted  . Pancreatic cancer metastasized to liver [facility]) 03/29/2015    Past Surgical History  Procedure Laterality Date  . Cholecystectomy      Current Outpatient Rx  Name  Route  Sig  Dispense  Refill  . ALPRAZolam (XANAX) 0.25 MG tablet            0   . aspirin 81 MG tablet   Oral   Take 81 mg by mouth daily.         . furosemide (LASIX) 40 MG tablet   Oral   Take 1 tablet (40 mg total) by mouth daily.   30 tablet   5   . levothyroxine (SYNTHROID, LEVOTHROID) 25 MCG tablet            3   . lidocaine-prilocaine (EMLA) cream               . polyethylene glycol powder (GLYCOLAX/MIRALAX) powder   Oral   Take 1 Container by mouth once as needed.        . prochlorperazine (COMPAZINE) 10 MG tablet      Reported on 12/18/2015      0   . propranolol (INDERAL) 40 MG tablet   Oral   Take 40 mg by mouth 2 (two) times daily.         . raloxifene (EVISTA) 60 MG tablet   Oral   Take 60 mg by mouth daily.           Allergies Review of patient's allergies indicates no known allergies.  No family history on file.  Social History Social History  Substance Use Topics  . Smoking status: Never Smoker   . Smokeless tobacco: Never Used  . Alcohol Use: Yes     Comment: occasional    Review of Systems Constitutional: No fever/chills. Positive syncope. Eyes: No visual changes. No blurred or double vision. ENT: No sore throat. Cardiovascular: Denies chest pain, palpitations. Respiratory: Denies shortness of breath.  No cough. Gastrointestinal: Positive upper abdominal pain.  No nausea, no vomiting.  No diarrhea.  No constipation. Positive hematochezia. Genitourinary: Negative for dysuria. Musculoskeletal: Negative for back pain. Skin: Negative for rash. Neurological: Negative for headaches, focal weakness or numbness.  10-point ROS otherwise negative.  ____________________________________________   PHYSICAL  EXAM:  VITAL SIGNS: ED Triage Vitals  Enc Vitals Group     BP 01/23/2016 1234 130/59 mmHg     Pulse Rate 01/27/2016 1234 76     Resp 01/08/2016 1234 18     Temp 01/17/2016 1234 97.8 F (36.6 C)     Temp Source 01/28/2016 1234 Oral     SpO2 01/24/2016 1234 98 %     Weight 01/17/2016 1234 111 lb 15.9 oz (50.8 kg)     Height 01/14/2016 1234 5\' 2"  (1.575 m)     Head Cir --      Peak Flow --      Pain Score --      Pain Loc --      Pain Edu? --      Excl. in [place]? --     Constitutional: Alert and oriented. Well appearing and in no acute distress. Answer question appropriately. Chronically ill appearing but nontoxic. Eyes: Conjunctivae are normal.  EOMI. mild conjunctival pallor. Head: Linear ecchymosis approximate 4 inches long  over the right cheek without skin break. Nose: No congestion/rhinnorhea. Mouth/Throat: Mucous membranes are mildly dry..  Neck: No stridor.  Supple.   Cardiovascular: Normal rate, regular rhythm. No murmurs, rubs or gallops.  Respiratory: Normal respiratory effort.  No retractions. Lungs CTAB.  No wheezes, rales or ronchi. Gastrointestinal: Abdomen is soft, mildly distended without a fluid wave. Diffuse tenderness to palpation in the epigastric area, right upper quadrant, and right lower quadrant. GU: Maroon stool that is guaiac positive. Multiple nonthrombosed, nonbleeding hemorrhoids externally. No pain with rectal exam. Musculoskeletal: Positive symmetric bilateral lower extremity edema. Neurologic:  Normal speech and language. No gross focal neurologic deficits are appreciated.  Skin:  Skin is warm, dry and intact. No rash noted. Positive jaundice. Psychiatric: Mood and affect are normal. Speech and behavior are normal.  Normal judgement.  ____________________________________________   LABS (all labs ordered are listed, but only abnormal results are displayed)  Labs Reviewed  BASIC METABOLIC PANEL - Abnormal; Notable for the following:    Glucose, Bld 171 (*)    BUN 29 (*)    Calcium 7.7 (*)    All other components within normal limits  CBC - Abnormal; Notable for the following:    WBC 11.6 (*)    RBC 2.02 (*)    Hemoglobin 5.9 (*)    HCT 18.4 (*)    RDW 16.0 (*)    All other components within normal limits  BLOOD GAS, VENOUS - Abnormal; Notable for the following:    pCO2, Ven 41 (*)    All other components within normal limits  TROPONIN I  URINALYSIS COMPLETEWITH MICROSCOPIC ([facility] ONLY)  LACTIC ACID, PLASMA  LACTIC ACID, PLASMA  HEPATIC FUNCTION PANEL  CBG MONITORING, ED  PREPARE RBC (CROSSMATCH)   ____________________________________________  EKG  ED ECG REPORT I, Eula Listen, the attending physician, personally viewed and interpreted this ECG.    Date: 01/21/2016  EKG Time: 1243  Rate: 85  Rhythm: normal sinus rhythm  Axis: Leftward  Intervals:first-degree A-V block   ST&T Change: Nonspecific T-wave inversions in V1. Poor baseline tracing. No ischemic changes apparent.  ____________________________________________  RADIOLOGY  Ct Head Wo Contrast  01/22/2016  CLINICAL DATA:  Syncope, bloody stool this morning stool this morning EXAM: CT HEAD WITHOUT CONTRAST TECHNIQUE: Contiguous axial images were obtained from the base of the skull through the vertex without intravenous contrast. COMPARISON:  None. FINDINGS: No skull fracture is noted. Atherosclerotic calcifications of carotid siphon. Mild cerebral  atrophy. No intracranial hemorrhage, mass effect or midline shift. Paranasal sinuses and mastoid air cells are unremarkable. Mild periventricular white matter decreased attenuation probable due to chronic small vessel ischemic changes no acute cortical infarction. No mass lesion is noted on this unenhanced scan. No intraventricular hemorrhage. IMPRESSION: No acute intracranial abnormality. Mild cerebral atrophy. Mild periventricular white matter decreased attenuation probable due to chronic small vessel ischemic changes. Atherosclerotic calcifications of carotid siphon. Electronically Signed   By: Lahoma Crocker M.D.   On: 01/09/2016 13:02    ____________________________________________   PROCEDURES  Procedure(s) performed: None  Critical Care performed: Yes, see critical care note(s) ____________________________________________   INITIAL IMPRESSION / ASSESSMENT AND PLAN / ED COURSE  Pertinent labs & imaging results that were available during my care of the patient were reviewed by me and considered in my medical decision making (see chart for details).  82 y.o. female with pancreatic cancer presenting for GI bleed, not anticoagulated. The patient did have a syncopal episode and I'm concerned this is due to anemia. With the syncopal  episode, and hematochezia rather than bright red blood or dark blood, I am concerned about a bleed that may be a brisk upper GI bleed. She has evidence of trauma on her face, so CT scan was obtained which did not show any acute process.  ----------------------------------------- 1:49 PM on 01/03/2016 -----------------------------------------  I was called to the room by the patient's daughter for decreased responsiveness. On arrival, the patient was completely unresponsive to verbal stimulus or sternal rub. She did have a pulse in the 70s. I placed the patient in reverse Trendelenburg and slowly her pallor improved and she began to give verbal responses. Her blood pressure 1 as low as 60s over 40s. And her heart rate was as low as the 50s. Emergency blood was ordered and obtained. The patient's mental status continued to improve. CRITICAL CARE Performed by: Eula Listen   Total critical care time: 45 minutes  Critical care time was exclusive of separately billable procedures and treating other patients.  Critical care was necessary to treat or prevent imminent or life-threatening deterioration.  Critical care was time spent personally by me on the following activities: development of treatment plan with patient and/or surrogate as well as nursing, discussions with consultants, evaluation of patient's response to treatment, examination of patient, obtaining history from patient or surrogate, ordering and performing treatments and interventions, ordering and review of laboratory studies, ordering and review of radiographic studies, pulse oximetry and re-evaluation of patient's condition.  ----------------------------------------- 2:30 PM on 01/21/2016 -----------------------------------------  The patient has been able to maintain blood pressures above the 120s. She has completed a liter of IV fluid, as well as 1.5 units of emergency release blood. She does have a significant blood loss  demonstrated by her anemia with a hematocrit of 18. I am concerned that she may be having ongoing bleeding. We will plan to admit her to the hospital for further treatment and evaluation.    ____________________________________________  FINAL CLINICAL IMPRESSION(S) / ED DIAGNOSES  Final diagnoses:  Gastrointestinal hemorrhage, unspecified gastritis, unspecified gastrointestinal hemorrhage type  Syncope, unspecified syncope type  Hypotension, unspecified hypotension type  Acute posthemorrhagic anemia      NEW MEDICATIONS STARTED DURING THIS VISIT:  New Prescriptions   No medications on file     Eula Listen, MD 01/21/2016 1439

## 2016-01-14 ENCOUNTER — Encounter: Admission: EM | Disposition: E | Payer: Self-pay | Source: Home / Self Care | Attending: Internal Medicine

## 2016-01-14 ENCOUNTER — Inpatient Hospital Stay: Payer: Medicare Other | Admitting: Anesthesiology

## 2016-01-14 DIAGNOSIS — Z515 Encounter for palliative care: Secondary | ICD-10-CM

## 2016-01-14 DIAGNOSIS — Z9221 Personal history of antineoplastic chemotherapy: Secondary | ICD-10-CM

## 2016-01-14 DIAGNOSIS — C787 Secondary malignant neoplasm of liver and intrahepatic bile duct: Secondary | ICD-10-CM

## 2016-01-14 DIAGNOSIS — K922 Gastrointestinal hemorrhage, unspecified: Secondary | ICD-10-CM

## 2016-01-14 DIAGNOSIS — R17 Unspecified jaundice: Secondary | ICD-10-CM

## 2016-01-14 DIAGNOSIS — D500 Iron deficiency anemia secondary to blood loss (chronic): Secondary | ICD-10-CM

## 2016-01-14 DIAGNOSIS — E119 Type 2 diabetes mellitus without complications: Secondary | ICD-10-CM

## 2016-01-14 DIAGNOSIS — K209 Esophagitis, unspecified: Secondary | ICD-10-CM

## 2016-01-14 DIAGNOSIS — C259 Malignant neoplasm of pancreas, unspecified: Secondary | ICD-10-CM

## 2016-01-14 DIAGNOSIS — I959 Hypotension, unspecified: Secondary | ICD-10-CM

## 2016-01-14 DIAGNOSIS — Z66 Do not resuscitate: Secondary | ICD-10-CM

## 2016-01-14 HISTORY — PX: ESOPHAGOGASTRODUODENOSCOPY: SHX5428

## 2016-01-14 LAB — BLOOD CULTURE ID PANEL (REFLEXED)
ACINETOBACTER BAUMANNII: NOT DETECTED
CANDIDA ALBICANS: NOT DETECTED
CANDIDA KRUSEI: NOT DETECTED
CANDIDA PARAPSILOSIS: NOT DETECTED
Candida glabrata: NOT DETECTED
Candida tropicalis: NOT DETECTED
Carbapenem resistance: NOT DETECTED
ENTEROBACTER CLOACAE COMPLEX: NOT DETECTED
ENTEROBACTERIACEAE SPECIES: DETECTED — AB
ENTEROCOCCUS SPECIES: NOT DETECTED
ESCHERICHIA COLI: DETECTED — AB
Haemophilus influenzae: NOT DETECTED
KLEBSIELLA OXYTOCA: NOT DETECTED
Klebsiella pneumoniae: NOT DETECTED
LISTERIA MONOCYTOGENES: NOT DETECTED
METHICILLIN RESISTANCE: NOT DETECTED
Neisseria meningitidis: NOT DETECTED
PSEUDOMONAS AERUGINOSA: NOT DETECTED
Proteus species: NOT DETECTED
STAPHYLOCOCCUS AUREUS BCID: NOT DETECTED
STREPTOCOCCUS AGALACTIAE: NOT DETECTED
STREPTOCOCCUS PYOGENES: NOT DETECTED
STREPTOCOCCUS SPECIES: NOT DETECTED
Serratia marcescens: NOT DETECTED
Staphylococcus species: NOT DETECTED
Streptococcus pneumoniae: NOT DETECTED
Vancomycin resistance: NOT DETECTED

## 2016-01-14 LAB — COMPREHENSIVE METABOLIC PANEL
ALK PHOS: 235 U/L — AB (ref 38–126)
ALT: 170 U/L — ABNORMAL HIGH (ref 14–54)
AST: 162 U/L — AB (ref 15–41)
Albumin: 2.4 g/dL — ABNORMAL LOW (ref 3.5–5.0)
Anion gap: 8 (ref 5–15)
BILIRUBIN TOTAL: 7 mg/dL — AB (ref 0.3–1.2)
BUN: 35 mg/dL — AB (ref 6–20)
CALCIUM: 7.6 mg/dL — AB (ref 8.9–10.3)
CO2: 23 mmol/L (ref 22–32)
CREATININE: 0.52 mg/dL (ref 0.44–1.00)
Chloride: 107 mmol/L (ref 101–111)
GFR calc Af Amer: >60 mL/min (ref >60)
GLUCOSE: 163 mg/dL — AB (ref 65–99)
Potassium: 3.9 mmol/L (ref 3.5–5.1)
Sodium: 138 mmol/L (ref 135–145)
TOTAL PROTEIN: 5.1 g/dL — AB (ref 6.5–8.1)

## 2016-01-14 LAB — CBC
HCT: 31.7 % — ABNORMAL LOW (ref 35.0–47.0)
Hemoglobin: 10.5 g/dL — ABNORMAL LOW (ref 12.0–16.0)
MCH: 29.3 pg (ref 26.0–34.0)
MCHC: 33.1 g/dL (ref 32.0–36.0)
MCV: 88.6 fL (ref 80.0–100.0)
PLATELETS: 160 10*3/uL (ref 150–440)
RBC: 3.58 MIL/uL — ABNORMAL LOW (ref 3.80–5.20)
RDW: 15.4 % — AB (ref 11.5–14.5)
WBC: 11.3 10*3/uL — ABNORMAL HIGH (ref 3.6–11.0)

## 2016-01-14 LAB — HEMOGLOBIN AND HEMATOCRIT, BLOOD
HCT: 22.9 % — ABNORMAL LOW (ref 35.0–47.0)
Hemoglobin: 7.6 g/dL — ABNORMAL LOW (ref 12.0–16.0)

## 2016-01-14 LAB — GLUCOSE, CAPILLARY
GLUCOSE-CAPILLARY: 152 mg/dL — AB (ref 65–99)
Glucose-Capillary: 144 mg/dL — ABNORMAL HIGH (ref 65–99)

## 2016-01-14 LAB — PREPARE RBC (CROSSMATCH)

## 2016-01-14 SURGERY — EGD (ESOPHAGOGASTRODUODENOSCOPY)
Anesthesia: General

## 2016-01-14 MED ORDER — EPHEDRINE SULFATE 50 MG/ML IJ SOLN
INTRAMUSCULAR | Status: DC | PRN
  Administered 2016-01-14: 2.5 mg via INTRAVENOUS

## 2016-01-14 MED ORDER — SODIUM CHLORIDE 0.9% FLUSH: 10.0000 mL | INTRAVENOUS | Status: DC | PRN

## 2016-01-14 MED ORDER — SODIUM CHLORIDE 0.9 % IV SOLN
Freq: Once | INTRAVENOUS | Status: AC
  Administered 2016-01-14: 06:00:00 via INTRAVENOUS

## 2016-01-14 MED ORDER — PROPRANOLOL HCL 40 MG PO TABS
40.0000 mg | ORAL_TABLET | Freq: Two times a day (BID) | ORAL | Status: DC
  Administered 2016-01-14: 40 mg via ORAL
  Filled 2016-01-14 (×3): qty 1
  Filled 2016-01-14: qty 2
  Filled 2016-01-14: qty 1

## 2016-01-14 MED ORDER — MORPHINE SULFATE (PF) 4 MG/ML IV SOLN
4.0000 mg | Freq: Once | INTRAVENOUS | Status: AC
  Administered 2016-01-14: 4 mg via INTRAVENOUS
  Filled 2016-01-14: qty 1

## 2016-01-14 MED ORDER — SODIUM CHLORIDE 0.9 % IV SOLN
INTRAVENOUS | Status: DC
  Administered 2016-01-14: 07:00:00 via INTRAVENOUS

## 2016-01-14 MED ORDER — LORAZEPAM 2 MG/ML PO CONC
0.5000 mg | ORAL | Status: DC | PRN
  Administered 2016-01-15: 01:00:00 0.5 mg via ORAL
  Filled 2016-01-14: qty 1

## 2016-01-14 MED ORDER — BISACODYL 10 MG RE SUPP: 10.0000 mg | Freq: Every day | RECTAL | Status: AC | PRN

## 2016-01-14 MED ORDER — SODIUM CHLORIDE 0.9% FLUSH
10.0000 mL | Freq: Two times a day (BID) | INTRAVENOUS | Status: DC
  Administered 2016-01-14: 10 mL

## 2016-01-14 MED ORDER — PHENYLEPHRINE HCL 10 MG/ML IJ SOLN
INTRAMUSCULAR | Status: DC | PRN
  Administered 2016-01-14: 25 ug via INTRAVENOUS

## 2016-01-14 MED ORDER — PROCHLORPERAZINE 25 MG RE SUPP
25.0000 mg | Freq: Three times a day (TID) | RECTAL | Status: DC | PRN
  Filled 2016-01-14: qty 1

## 2016-01-14 MED ORDER — PROCHLORPERAZINE 25 MG RE SUPP: 25.0000 mg | Freq: Three times a day (TID) | RECTAL | Status: AC | PRN

## 2016-01-14 MED ORDER — MORPHINE SULFATE (CONCENTRATE) 10 MG/0.5ML PO SOLN
5.0000 mg | ORAL | Status: DC | PRN
  Administered 2016-01-14 – 2016-01-15 (×2): 5 mg via ORAL
  Filled 2016-01-14 (×2): qty 1

## 2016-01-14 MED ORDER — PROPOFOL 500 MG/50ML IV EMUL
INTRAVENOUS | Status: DC | PRN
  Administered 2016-01-14: 50 ug/kg/min via INTRAVENOUS

## 2016-01-14 MED ORDER — MORPHINE SULFATE (CONCENTRATE) 10 MG/0.5ML PO SOLN: 5.0000 mg | Freq: Three times a day (TID) | ORAL | Status: AC

## 2016-01-14 MED ORDER — LORAZEPAM 2 MG/ML PO CONC: 0.5000 mg | ORAL | Status: AC | PRN

## 2016-01-14 MED ORDER — PIPERACILLIN-TAZOBACTAM 3.375 G IVPB
3.3750 g | Freq: Three times a day (TID) | INTRAVENOUS | Status: DC
  Administered 2016-01-14 (×2): 3.375 g via INTRAVENOUS
  Filled 2016-01-14 (×5): qty 50

## 2016-01-14 MED ORDER — MORPHINE SULFATE (CONCENTRATE) 10 MG/0.5ML PO SOLN: 5.0000 mg | ORAL | Status: AC | PRN

## 2016-01-14 MED ORDER — PIPERACILLIN-TAZOBACTAM 3.375 G IVPB
3.3750 g | Freq: Three times a day (TID) | INTRAVENOUS | Status: DC
  Filled 2016-01-14 (×3): qty 50

## 2016-01-14 MED ORDER — MORPHINE SULFATE (CONCENTRATE) 10 MG/0.5ML PO SOLN
5.0000 mg | Freq: Three times a day (TID) | ORAL | Status: DC
  Administered 2016-01-14: 5 mg via ORAL
  Filled 2016-01-14: qty 1

## 2016-01-14 MED ORDER — LIDOCAINE HCL (CARDIAC) 20 MG/ML IV SOLN
INTRAVENOUS | Status: DC | PRN
  Administered 2016-01-14: 40 mg via INTRAVENOUS

## 2016-01-14 MED ORDER — SODIUM CHLORIDE 0.9 % IV SOLN: Freq: Once | INTRAVENOUS | Status: AC

## 2016-01-14 MED ORDER — BISACODYL 10 MG RE SUPP: 10.0000 mg | Freq: Every day | RECTAL | Status: DC | PRN

## 2016-01-14 NOTE — Consult Note (Signed)
Subjective Patient seen for GI bleeding. Mild generalized discjomfort.  Mostly epigastric.  No nausea or emesis.  One bloody bm overnight.  Hemodynamically stable.   Objective: Vital signs in last 24 hours: Temp:  [97.5 F (36.4 C)-98.2 F (36.8 C)] 97.7 F (36.5 C) (02/14 0531) Pulse Rate:  [59-80] 66 (02/14 0531) Resp:  [14-23] 18 (02/14 0531) BP: (93-176)/(44-89) 125/44 mmHg (02/14 0531) SpO2:  [90 %-100 %] 99 % (02/14 0531) Weight:  [50.8 kg (111 lb 15.9 oz)-54.069 kg (119 lb 3.2 oz)] 53.434 kg (117 lb 12.8 oz) (02/14 0404) Blood pressure 125/44, pulse 66, temperature 97.7 F (36.5 C), temperature source Oral, resp. rate 18, height 5\' 2"  (1.575 m), weight 53.434 kg (117 lb 12.8 oz), SpO2 99 %.   Intake/Output from previous day: 02/13 0701 - 02/14 0700 In: 370 [I.V.:10; Blood:360] Out: 200 [Urine:200]  Intake/Output this shift: Total I/O In: 10 [I.V.:10] Out: 200 [Urine:200]   General appearance:  40 female no distress. Resp:  bcta Cardio:  rrr without rub or gallop GI:  Soft mild generalized tenderness, moreso epigastrum.  Extremities:  Positive 3+ bilateral lower extremity edema.    Lab Results: Results for orders placed or performed during the hospital encounter of 01/07/2016 (from the past 24 hour(s))  Basic metabolic panel     Status: Abnormal   Collection Time: 01/10/2016  1:50 PM  Result Value Ref Range   Sodium 137 135 - 145 mmol/L   Potassium 4.1 3.5 - 5.1 mmol/L   Chloride 108 101 - 111 mmol/L   CO2 24 22 - 32 mmol/L   Glucose, Bld 171 (H) 65 - 99 mg/dL   BUN 29 (H) 6 - 20 mg/dL   Creatinine, Ser 0.61 0.44 - 1.00 mg/dL   Calcium 7.7 (L) 8.9 - 10.3 mg/dL   GFR calc non Af Amer >60 >60 mL/min   GFR calc Af Amer >60 >60 mL/min   Anion gap 5 5 - 15  CBC     Status: Abnormal   Collection Time: 01/03/2016  1:50 PM  Result Value Ref Range   WBC 11.6 (H) 3.6 - 11.0 K/uL   RBC 2.02 (L) 3.80 - 5.20 MIL/uL   Hemoglobin 5.9 (L) 12.0 - 16.0 g/dL   HCT 18.4 (L) 35.0  - 47.0 %   MCV 90.9 80.0 - 100.0 fL   MCH 29.4 26.0 - 34.0 pg   MCHC 32.4 32.0 - 36.0 g/dL   RDW 16.0 (H) 11.5 - 14.5 %   Platelets 159 150 - 440 K/uL  Troponin I     Status: None   Collection Time: 01/17/2016  1:50 PM  Result Value Ref Range   Troponin I 0.03 <0.031 ng/mL  Blood gas, venous     Status: Abnormal (Preliminary result)   Collection Time: 01/05/2016  1:50 PM  Result Value Ref Range   FIO2 0.21    pH, Ven 7.41 7.320 - 7.430   pCO2, Ven 41 (L) 44.0 - 60.0 mmHg   pO2, Ven PENDING 30.0 - 45.0 mmHg   Bicarbonate 26.0 21.0 - 28.0 mEq/L   Acid-Base Excess 1.2 0.0 - 3.0 mmol/L   Patient temperature 37.0    Collection site VENOUS    Sample type VENOUS   Lactic acid, plasma     Status: Abnormal   Collection Time: 01/23/2016  1:50 PM  Result Value Ref Range   Lactic Acid, Venous 2.6 (HH) 0.5 - 2.0 mmol/L  Prepare RBC     Status: None  Collection Time: 01/17/2016  2:00 PM  Result Value Ref Range   Order Confirmation ORDER PROCESSED BY BLOOD BANK   Urinalysis complete, with microscopic ([facility] only)     Status: Abnormal   Collection Time: 01/05/2016  3:28 PM  Result Value Ref Range   Color, Urine AMBER (A) YELLOW   APPearance CLOUDY (A) CLEAR   Glucose, UA NEGATIVE NEGATIVE mg/dL   Bilirubin Urine 1+ (A) NEGATIVE   Ketones, ur TRACE (A) NEGATIVE mg/dL   Specific Gravity, Urine 1.031 (H) 1.005 - 1.030   Hgb urine dipstick 3+ (A) NEGATIVE   pH 5.0 5.0 - 8.0   Protein, ur 30 (A) NEGATIVE mg/dL   Nitrite NEGATIVE NEGATIVE   Leukocytes, UA 2+ (A) NEGATIVE   RBC / HPF 6-30 0 - 5 RBC/hpf   WBC, UA TOO NUMEROUS TO COUNT 0 - 5 WBC/hpf   Bacteria, UA NONE SEEN NONE SEEN   Squamous Epithelial / LPF TOO NUMEROUS TO COUNT (A) NONE SEEN  CULTURE, BLOOD (ROUTINE X 2) w Reflex to PCR ID Panel     Status: None (Preliminary result)   Collection Time: 01/27/2016  3:28 PM  Result Value Ref Range   Specimen Description BLOOD RIGHT CHEST    Special Requests BOTTLES DRAWN AEROBIC AND ANAEROBIC  5CC     Culture  Setup Time      GRAM NEGATIVE RODS IN BOTH AEROBIC AND ANAEROBIC BOTTLES CRITICAL RESULT CALLED TO, READ BACK BY AND VERIFIED WITH: NATE COOKSON ON 01/04/2016 AT 0329 [facility]    Culture GRAM NEGATIVE RODS IDENTIFICATION TO FOLLOW     Report Status PENDING   Type and screen     Status: None (Preliminary result)   Collection Time: 01/05/2016  3:28 PM  Result Value Ref Range   ABO/RH(D) A POS    Antibody Screen NEG    Sample Expiration 02/12/2016    Unit Number [id number]    Blood Component Type RED CELLS,LR    Unit division 00    Status of Unit ISSUED    Transfusion Status OK TO TRANSFUSE    Crossmatch Result Compatible    Unit Number [id number]    Blood Component Type RED CELLS,LR    Unit division 00    Status of Unit ISSUED    Transfusion Status OK TO TRANSFUSE    Crossmatch Result Compatible    Unit Number [id number]    Blood Component Type RED CELLS,LR    Unit division 00    Status of Unit ISSUED    Transfusion Status OK TO TRANSFUSE    Crossmatch Result Compatible    Unit Number [id number]    Blood Component Type RBC, LR IRR    Unit division 00    Status of Unit ALLOCATED    Transfusion Status OK TO TRANSFUSE    Crossmatch Result Compatible   Blood Culture ID Panel (Reflexed)     Status: Abnormal   Collection Time: 01/15/2016  3:28 PM  Result Value Ref Range   Enterococcus species NOT DETECTED NOT DETECTED   Listeria monocytogenes NOT DETECTED NOT DETECTED   Staphylococcus species NOT DETECTED NOT DETECTED   Staphylococcus aureus NOT DETECTED NOT DETECTED   Streptococcus species NOT DETECTED NOT DETECTED   Streptococcus agalactiae NOT DETECTED NOT DETECTED   Streptococcus pneumoniae NOT DETECTED NOT DETECTED   Streptococcus pyogenes NOT DETECTED NOT DETECTED   Acinetobacter baumannii NOT DETECTED NOT DETECTED   Enterobacteriaceae species DETECTED (A) NOT DETECTED   Enterobacter cloacae complex NOT DETECTED NOT  DETECTED   Escherichia coli DETECTED  (A) NOT DETECTED   Klebsiella oxytoca NOT DETECTED NOT DETECTED   Klebsiella pneumoniae NOT DETECTED NOT DETECTED   Proteus species NOT DETECTED NOT DETECTED   Serratia marcescens NOT DETECTED NOT DETECTED   Haemophilus influenzae NOT DETECTED NOT DETECTED   Neisseria meningitidis NOT DETECTED NOT DETECTED   Pseudomonas aeruginosa NOT DETECTED NOT DETECTED   Candida albicans NOT DETECTED NOT DETECTED   Candida glabrata NOT DETECTED NOT DETECTED   Candida krusei NOT DETECTED NOT DETECTED   Candida parapsilosis NOT DETECTED NOT DETECTED   Candida tropicalis NOT DETECTED NOT DETECTED   Carbapenem resistance NOT DETECTED NOT DETECTED   Methicillin resistance NOT DETECTED NOT DETECTED   Vancomycin resistance NOT DETECTED NOT DETECTED  ABO/Rh     Status: None   Collection Time: 01/24/2016  3:29 PM  Result Value Ref Range   ABO/RH(D) A POS   Glucose, capillary     Status: Abnormal   Collection Time: 01/25/2016  6:43 PM  Result Value Ref Range   Glucose-Capillary 143 (H) 65 - 99 mg/dL   Comment 1 Notify RN   Lactic acid, plasma     Status: None   Collection Time: 01/23/2016  7:42 PM  Result Value Ref Range   Lactic Acid, Venous 2.0 0.5 - 2.0 mmol/L  Hepatic function panel     Status: Abnormal   Collection Time: 01/19/2016  7:42 PM  Result Value Ref Range   Total Protein 5.7 (L) 6.5 - 8.1 g/dL   Albumin 2.8 (L) 3.5 - 5.0 g/dL   AST 216 (H) 15 - 41 U/L   ALT 200 (H) 14 - 54 U/L   Alkaline Phosphatase 276 (H) 38 - 126 U/L   Total Bilirubin 4.5 (H) 0.3 - 1.2 mg/dL   Bilirubin, Direct 3.1 (H) 0.1 - 0.5 mg/dL   Indirect Bilirubin 1.4 (H) 0.3 - 0.9 mg/dL  Protime-INR     Status: None   Collection Time: 01/15/2016  7:42 PM  Result Value Ref Range   Prothrombin Time 14.3 11.4 - 15.0 seconds   INR 1.09   CBC with Differential/Platelet     Status: Abnormal   Collection Time: 01/15/2016  7:42 PM  Result Value Ref Range   WBC 14.1 (H) 3.6 - 11.0 K/uL   RBC 3.58 (L) 3.80 - 5.20 MIL/uL   Hemoglobin  10.7 (L) 12.0 - 16.0 g/dL   HCT 32.2 (L) 35.0 - 47.0 %   MCV 89.9 80.0 - 100.0 fL   MCH 29.9 26.0 - 34.0 pg   MCHC 33.3 32.0 - 36.0 g/dL   RDW 15.1 (H) 11.5 - 14.5 %   Platelets 173 150 - 440 K/uL   Neutrophils Relative % 92% %   Neutro Abs 13.0 (H) 1.4 - 6.5 K/uL   Lymphocytes Relative 3% %   Lymphs Abs 0.5 (L) 1.0 - 3.6 K/uL   Monocytes Relative 5% %   Monocytes Absolute 0.6 0.2 - 0.9 K/uL   Eosinophils Relative 0% %   Eosinophils Absolute 0.0 0 - 0.7 K/uL   Basophils Relative 0% %   Basophils Absolute 0.0 0 - 0.1 K/uL  Glucose, capillary     Status: Abnormal   Collection Time: 01/12/2016  9:05 PM  Result Value Ref Range   Glucose-Capillary 164 (H) 65 - 99 mg/dL  Hemoglobin and hematocrit, blood     Status: Abnormal   Collection Time: 01/15/2016  3:25 AM  Result Value Ref Range   Hemoglobin  7.6 (L) 12.0 - 16.0 g/dL   HCT 22.9 (L) 35.0 - 47.0 %      Recent Labs  01/15/2016 1350 01/11/2016 1942 01/15/2016 0325  WBC 11.6* 14.1*  --   HGB 5.9* 10.7* 7.6*  HCT 18.4* 32.2* 22.9*  PLT 159 173  --    BMET  Recent Labs  01/07/2016 1350  NA 137  K 4.1  CL 108  CO2 24  GLUCOSE 171*  BUN 29*  CREATININE 0.61  CALCIUM 7.7*   LFT  Recent Labs  01/09/2016 1942  PROT 5.7*  ALBUMIN 2.8*  AST 216*  ALT 200*  ALKPHOS 276*  BILITOT 4.5*  BILIDIR 3.1*  IBILI 1.4*   PT/INR  Recent Labs  01/14/2016 1942  LABPROT 14.3  INR 1.09   Hepatitis Panel No results for input(s): HEPBSAG, HCVAB, HEPAIGM, HEPBIGM in the last 72 hours. C-Diff No results for input(s): CDIFFTOX in the last 72 hours. No results for input(s): CDIFFPCR in the last 72 hours.   Studies/Results: Dg Chest 1 View  01/10/2016  CLINICAL DATA:  Sepsis, pancreatic cancer, syncope EXAM: CHEST 1 VIEW COMPARISON:  12/11/2015 FINDINGS: Borderline cardiomegaly. No infiltrate or pleural effusion. No pulmonary edema. Mild elevation of the right hemidiaphragm. IMPRESSION: No active disease. Electronically Signed   By:  Lahoma Crocker M.D.   On: 01/05/2016 15:24   Ct Head Wo Contrast  01/08/2016  CLINICAL DATA:  Syncope, bloody stool this morning stool this morning EXAM: CT HEAD WITHOUT CONTRAST TECHNIQUE: Contiguous axial images were obtained from the base of the skull through the vertex without intravenous contrast. COMPARISON:  None. FINDINGS: No skull fracture is noted. Atherosclerotic calcifications of carotid siphon. Mild cerebral atrophy. No intracranial hemorrhage, mass effect or midline shift. Paranasal sinuses and mastoid air cells are unremarkable. Mild periventricular white matter decreased attenuation probable due to chronic small vessel ischemic changes no acute cortical infarction. No mass lesion is noted on this unenhanced scan. No intraventricular hemorrhage. IMPRESSION: No acute intracranial abnormality. Mild cerebral atrophy. Mild periventricular white matter decreased attenuation probable due to chronic small vessel ischemic changes. Atherosclerotic calcifications of carotid siphon. Electronically Signed   By: Lahoma Crocker M.D.   On: 01/15/2016 13:02   Nm Gi Blood Loss  01/12/2016  CLINICAL DATA:  82 year old female with history of pancreatic cancer presents with GI bleed and symptomatic anemia with syncope earlier today. EXAM: NUCLEAR MEDICINE GASTROINTESTINAL BLEEDING SCAN TECHNIQUE: Sequential abdominal images were obtained following intravenous administration of Tc-67m labeled red blood cells. RADIOPHARMACEUTICALS:  22.0 mCi Tc-38m in-vitro labeled red cells. COMPARISON:  12/11/2015 PET-CT. FINDINGS: There is active GI bleed beginning at minute 38 in the central abdomen, which is located just above the level of the aortic bifurcation and extends from the medial central right abdomen horizontally into the medial central left abdomen, favor distal (3rd/4th portion) duodenal hemorrhage. No additional sites of active GI bleed. IMPRESSION: Active GI bleed in the central abdomen, favor distal duodenal hemorrhage.  These results were called by telephone at the time of interpretation on 01/19/2016 at 6:01 pm to Dr. Nicholes Mango , who verbally acknowledged these results. Electronically Signed   By: Ilona Sorrel M.D.   On: 01/20/2016 18:26    Scheduled Inpatient Medications:   . antiseptic oral rinse  7 mL Mouth Rinse BID  . insulin aspart  0-5 Units Subcutaneous QHS  . insulin aspart  0-9 Units Subcutaneous TID WC  . levothyroxine  25 mcg Oral QODAY  . levothyroxine  50 mcg  Oral QODAY  . pantoprazole (PROTONIX) IV  80 mg Intravenous Once  . [START ON 01/17/2016] pantoprazole (PROTONIX) IV  40 mg Intravenous Q12H  . piperacillin-tazobactam (ZOSYN)  IV  3.375 g Intravenous 3 times per day  . sodium chloride flush  10-40 mL Intracatheter Q12H  . sodium chloride flush  3 mL Intravenous Q12H    Continuous Inpatient Infusions:   . pantoprozole (PROTONIX) infusion 8 mg/hr (01/02/2016 1835)    PRN Inpatient Medications:  acetaminophen, ondansetron **OR** ondansetron (ZOFRAN) IV, oxyCODONE, sodium chloride flush  Miscellaneous:   Assessment:  1) 89 f, melena.-hemodynamically stable.  Transfusing one unit prbc.  Proceeding to EGD this am.   2) positive blood culture noted, on zosyn  Plan:  1) as above.   I have discussed the risks benefits and complications of procedures to include not limited to bleeding, infection, perforation and the risk of sedation and the patient wishes to proceed.  Lollie Sails MD 01/04/2016, 6:57 AM

## 2016-01-14 NOTE — Anesthesia Preprocedure Evaluation (Signed)
Anesthesia Evaluation  Patient identified by MRN, date of birth, ID band Patient awake    Reviewed: Allergy & Precautions, H&P , NPO status , Patient's Chart, lab work & pertinent test results, reviewed documented beta blocker date and time   Airway Mallampati: II   Neck ROM: full    Dental  (+) Poor Dentition   Pulmonary neg pulmonary ROS,    Pulmonary exam normal        Cardiovascular negative cardio ROS Normal cardiovascular exam     Neuro/Psych negative neurological ROS  negative psych ROS   GI/Hepatic negative GI ROS, Neg liver ROS, GERD  ,  Endo/Other  negative endocrine ROSdiabetesHypothyroidism   Renal/GU negative Renal ROS  negative genitourinary   Musculoskeletal   Abdominal   Peds  Hematology negative hematology ROS (+)   Anesthesia Other Findings Past Medical History:   Hypothyroidism                                               Skin cancer                                                  Pancreatic cancer ([place])                                      GERD (gastroesophageal reflux disease)                       Osteoporosis                                                 Diabetes mellitus without complication (HCC)               Past Surgical History:   CHOLECYSTECTOMY                                             BMI    Body Mass Index   21.54 kg/m 2     Reproductive/Obstetrics negative OB ROS                             Anesthesia Physical Anesthesia Plan  ASA: III  Anesthesia Plan: General   Post-op Pain Management:    Induction:   Airway Management Planned:   Additional Equipment:   Intra-op Plan:   Post-operative Plan:   Informed Consent: I have reviewed the patients History and Physical, chart, labs and discussed the procedure including the risks, benefits and alternatives for the proposed anesthesia with the patient or authorized representative who has  indicated his/her understanding and acceptance.   Dental Advisory Given  Plan Discussed with: CRNA  Anesthesia Plan Comments:         Anesthesia Quick Evaluation

## 2016-01-14 NOTE — Progress Notes (Signed)
Per Dr. Darvin Neighbours okay to order regular diet at this time.  Amy Mckenzie

## 2016-01-14 NOTE — Op Note (Signed)
[facility] Gastroenterology Patient Name: Amy Mckenzie Procedure Date: 01/05/2016 7:21 AM MRN: [id number] Account #: [id number] Date of Birth: 1920/12/03 Admit Type: Inpatient Age: 82 Room: [facility] ENDO ROOM 3 Gender: Female Note Status: Finalized Procedure:         Upper GI endoscopy Indications:       [place] Providers:         Lollie Sails, MD Referring MD:      Kathlene November. Grayland Ormond, MD (Referring MD) Medicines:         Monitored Anesthesia Care Complications:     No immediate complications. Procedure:         Pre-Anesthesia Assessment:                    - ASA Grade Assessment: IV - A patient with severe                     systemic disease that is a constant threat to life.                    After obtaining informed consent, the endoscope was passed                     under direct vision. Throughout the procedure, the                     patient's blood pressure, pulse, and oxygen saturations                     were monitored continuously. The Endoscope was introduced                     through the mouth, and advanced to the third part of                     duodenum. The upper GI endoscopy was accomplished without                     difficulty. Findings:      LA Grade B (one or more mucosal breaks greater than 5 mm, not extending       between the tops of two mucosal folds) esophagitis with no bleeding was       found.      Diffuse moderate inflammation characterized by adherent blood,       congestion (edema) and linear erosions was found in the gastric body.      A medium-sized hiatal hernia was present.      A few erosions without bleeding were found in the second portion of the       duodenum.      Patchy mild inflammation characterized by erosions and erythema was       found in the second portion of the duodenum and in the third portion of       the duodenum. There was minimal evidence of fresh bleeding. The edges of       the biliary  stent are noted with some debris in the mesh. There was a       minimal trickle of red blood from the opening of the biliary stent.       There was no reflux of blood from the duodenum more distally, and I was       unable to pass the scope further than the 3rd portion.  retroflex evaluation showed no other lesions than described as above. Impression:        - LA Grade B erosive esophagitis.                    - Erosive gastritis.                    - Medium-sized hiatal hernia.                    - Duodenal erosions without bleeding.                    - Duodenitis.                    - No specimens collected. Recommendation:    - Return patient to hospital ward for ongoing care.                    - Continue present medications.                    - NPO except ice chips.                    - serial hemoglobin, transfuse as needed                    - consult vascular surgery Lollie Sails, MD 01/22/2016 7:59:15 AM This report has been signed electronically. Number of Addenda: 0 Note Initiated On: 01/28/2016 7:21 AM      [facility]

## 2016-01-14 NOTE — Progress Notes (Signed)
Pharmacy Antibiotic Note  Amy Mckenzie is a 82 y.o. female admitted on 01/19/2016 with bacteremia.  Pharmacy has been consulted for Zosyn dosing for positive BioFire at [facility] 02/14. Of note, patient hasn't received any zosyn today. Ordered first dose stat at 1545.   Plan: Zosyn 3.375 gm IV Q8H EI due to renal function. Pharmacy will continue to follow.   Height: 5\' 2"  (157.5 cm) Weight: 117 lb 12.8 oz (53.434 kg) IBW/kg (Calculated) : 50.1  Temp (24hrs), Avg:97.7 F (36.5 C), Min:96.3 F (35.7 C), Max:98.4 F (36.9 C)   Recent Labs Lab 01/12/2016 1350 01/23/2016 1942 01/21/2016 0930  WBC 11.6* 14.1* 11.3*  CREATININE 0.61  --  0.52  LATICACIDVEN 2.6* 2.0  --     Estimated Creatinine Clearance: 35.5 mL/min (by C-G formula based on Cr of 0.52).    No Known Allergies   Thank you for allowing pharmacy to be a part of this patient's care.  Roe Coombs, PharmD Pharmacy Resident 01/10/2016

## 2016-01-14 NOTE — Progress Notes (Signed)
New referral for [facility] services at home following discharge.received from [place] following a Palliative Care consult with Dr. Megan Salon. Amy Mckenzie is a 82 year old woman diagnosed with Pancreatic cancer a year ago, she had a pancreatic stent placed last year and has been followed by Dr. Grayland Ormond, receiving chemotherapy which she stopped in January d/t side effects of memory loss and leg swelling. She was admitted to [facility] on 2/13 for evaluation of new onset rectal bleeding and symptomatic anemia.  She has received 1 unit of PRBC's and underwent an endoscopy this morning which revealed low grade esophagitis and diffuse inflammation, no active bleeding. She has experienced increased abdominal pain today, lab results show elevated liver functions and bilirubin. Patient's daughters met with Dr. Megan Salon and have chosen not to pursue any further treatment and have patient return to her daughter's home to focus on comfort and symptom management. Pain was being managed with oxycodone IR 5 mg q 6 hrs PRN, this has been changed by Dr. Megan Salon to liquid morphine 5 mg TID, with a  PRN dose of 5 mg q 2 hrs PRN.  She has also required zofran 4 mg this morning for nausea. Writer met in the patient's room with patient, her daughter Amy Mckenzie, with whom she lives and Amy Mckenzie, who is visiting. Education was initiated regarding hospice services, philosophy and team approach to care with good understanding voiced by all. Patient remained alert and interactive through out visit. Amy Mckenzie has requested a hospital bed to be delivered tomorrow. She is uncertain if patient will need EMS transport home, she is very weak and this will be reassessed prior to discharge tomorrow. Information faxed to referral. Will follow though final disposition. Thank you for the opportunity to be involved in the care of this patient. Flo Shanks RN, BSN, [place] [facility] [place] [phone]

## 2016-01-14 NOTE — Progress Notes (Signed)
[facility] NAME: Amy Mckenzie    MR#:  [id number]  DATE OF BIRTH:  [birth year]  SUBJECTIVE:  CHIEF COMPLAINT:   Chief Complaint  Patient presents with  . Loss of Consciousness  . Rectal Bleeding   Abdominal pain. REVIEW OF SYSTEMS:  CONSTITUTIONAL: No fever, fatigue or weakness.  EYES: No blurred or double vision.  EARS, NOSE, AND THROAT: No tinnitus or ear pain.  RESPIRATORY: No cough, shortness of breath, wheezing or hemoptysis.  CARDIOVASCULAR: No chest pain, orthopnea, edema.  GASTROINTESTINAL: No nausea, vomiting, diarrhea but has epigastric abdominal pain. No melena or bloody stool. GENITOURINARY: No dysuria, hematuria.  ENDOCRINE: No polyuria, nocturia,  HEMATOLOGY: No anemia, easy bruising or bleeding SKIN: No rash or lesion. MUSCULOSKELETAL: No joint pain or arthritis.   NEUROLOGIC: No tingling, numbness, weakness.  PSYCHIATRY: No anxiety or depression.   DRUG ALLERGIES:  No Known Allergies  VITALS:  Blood pressure 123/44, pulse 71, temperature 98.4 F (36.9 C), temperature source Oral, resp. rate 20, height 5\' 2"  (1.575 m), weight 53.434 kg (117 lb 12.8 oz), SpO2 99 %.  PHYSICAL EXAMINATION:  GENERAL:  82 y.o.-year-old patient lying in the bed with no acute distress.  EYES: Pupils equal, round, reactive to light and accommodation. No scleral icterus. Extraocular muscles intact.  HEENT: Head atraumatic, normocephalic. Oropharynx and nasopharynx clear.  NECK:  Supple, no jugular venous distention. No thyroid enlargement, no tenderness.  LUNGS: Normal breath sounds bilaterally, no wheezing, rales,rhonchi or crepitation. No use of accessory muscles of respiration.  CARDIOVASCULAR: S1, S2 normal. No murmurs, rubs, or gallops.  ABDOMEN: Soft, tenderness in epigastric area, nondistended. Bowel sounds present. No organomegaly or mass.  EXTREMITIES: No pedal edema, cyanosis, or clubbing.  NEUROLOGIC: Cranial  nerves II through XII are intact. Muscle strength 4/5 in all extremities. Sensation intact. Gait not checked.  PSYCHIATRIC: The patient is alert and oriented x 3.  SKIN: No obvious rash, lesion, or ulcer.    LABORATORY PANEL:   CBC  Recent Labs Lab 01/04/2016 0930  WBC 11.3*  HGB 10.5*  HCT 31.7*  PLT 160   ------------------------------------------------------------------------------------------------------------------  Chemistries   Recent Labs Lab 01/19/2016 0930  NA 138  K 3.9  CL 107  CO2 23  GLUCOSE 163*  BUN 35*  CREATININE 0.52  CALCIUM 7.6*  AST 162*  ALT 170*  ALKPHOS 235*  BILITOT 7.0*   ------------------------------------------------------------------------------------------------------------------  Cardiac Enzymes  Recent Labs Lab 01/05/2016 1350  TROPONINI 0.03   ------------------------------------------------------------------------------------------------------------------  RADIOLOGY:  Dg Chest 1 View  01/15/2016  CLINICAL DATA:  Sepsis, pancreatic cancer, syncope EXAM: CHEST 1 VIEW COMPARISON:  12/11/2015 FINDINGS: Borderline cardiomegaly. No infiltrate or pleural effusion. No pulmonary edema. Mild elevation of the right hemidiaphragm. IMPRESSION: No active disease. Electronically Signed   By: Lahoma Crocker M.D.   On: 01/27/2016 15:24   Ct Head Wo Contrast  01/03/2016  CLINICAL DATA:  Syncope, bloody stool this morning stool this morning EXAM: CT HEAD WITHOUT CONTRAST TECHNIQUE: Contiguous axial images were obtained from the base of the skull through the vertex without intravenous contrast. COMPARISON:  None. FINDINGS: No skull fracture is noted. Atherosclerotic calcifications of carotid siphon. Mild cerebral atrophy. No intracranial hemorrhage, mass effect or midline shift. Paranasal sinuses and mastoid air cells are unremarkable. Mild periventricular white matter decreased attenuation probable due to chronic small vessel ischemic changes no acute  cortical infarction. No mass lesion is noted on this unenhanced scan. No intraventricular hemorrhage. IMPRESSION:  No acute intracranial abnormality. Mild cerebral atrophy. Mild periventricular white matter decreased attenuation probable due to chronic small vessel ischemic changes. Atherosclerotic calcifications of carotid siphon. Electronically Signed   By: Lahoma Crocker M.D.   On: 01/15/2016 13:02   Nm Gi Blood Loss  01/20/2016  CLINICAL DATA:  82 year old female with history of pancreatic cancer presents with GI bleed and symptomatic anemia with syncope earlier today. EXAM: NUCLEAR MEDICINE GASTROINTESTINAL BLEEDING SCAN TECHNIQUE: Sequential abdominal images were obtained following intravenous administration of Tc-31m labeled red blood cells. RADIOPHARMACEUTICALS:  22.0 mCi Tc-37m in-vitro labeled red cells. COMPARISON:  12/11/2015 PET-CT. FINDINGS: There is active GI bleed beginning at minute 38 in the central abdomen, which is located just above the level of the aortic bifurcation and extends from the medial central right abdomen horizontally into the medial central left abdomen, favor distal (3rd/4th portion) duodenal hemorrhage. No additional sites of active GI bleed. IMPRESSION: Active GI bleed in the central abdomen, favor distal duodenal hemorrhage. These results were called by telephone at the time of interpretation on 01/23/2016 at 6:01 pm to Dr. Nicholes Mango , who verbally acknowledged these results. Electronically Signed   By: Ilona Sorrel M.D.   On: 01/08/2016 18:26    EKG:   Orders placed or performed during the hospital encounter of 01/02/2016  . ED EKG  . ED EKG  . EKG 12-Lead  . EKG 12-Lead    ASSESSMENT AND PLAN:   1. Syncope secondary to symptomatic anemia from GI bleed with hemoglobin 5.9  Status post 3 units of blood transfusion and fluid boluses f/u hemoglobin and hematocrit in am. EGD by Dr. Donnella Sham:  LA Grade B erosive esophagitis.  - Erosive  gastritis.  - Medium-sized hiatal hernia.  - Duodenal erosions without bleeding.  - Duodenitis. C  Bleeding scan: Active GI bleed in the central abdomen, favor distal duodenal hemorrhage. continue Protonix drip.  2. Pancreatic cancer with liver metastases Patient has discontinued her chemotherapy recently in January Status post pancreatic stent Provide pain management as needed basis, f/u Dr. Grayland Ormond.  3. Sepsis with UTI and bacteremia. Blood cultures: GNR, continue zosyn for now.  4. Chronic history of hypothyroidism continue Synthroid Patient takes 50 g and alternates with 25 g every other day.   5. Chronic history of diabetes mellitus not on any medications On sliding scale as well. Currently patient is nothing by mouth.   All the records are reviewed and case discussed with Care Management/Social Workerr. Management plans discussed with the patient, her daughters (POA) and they are in agreement. Her daughter want patient on hospice care. I discussed with Dr. Megan Salon for palliative care.  CODE STATUS: DNR  TOTAL TIME TAKING CARE OF THIS PATIENT: 45 minutes.  Greater than 50% time was spent on coordination of care and face-to-face counseling.  POSSIBLE D/C IN 2 DAYS, DEPENDING ON CLINICAL CONDITION.   Demetrios Loll M.D on 01/15/2016 at 3:23 PM  Between 7am to 6pm - Pager - [phone]  After 6pm go to [email or web address] - password [email or web address] [facility]  Office  [phone]  CC: Primary care physician; Lloyd Huger, MD

## 2016-01-14 NOTE — Consult Note (Signed)
Consult is cancelled at the families request

## 2016-01-14 NOTE — Consult Note (Signed)
Palliative Medicine Inpatient Consult Note   Name: Amy Mckenzie Date: 01/17/2016 MRN: [id number]  DOB: 07/23/1921  Referring Physician: Qing Chen, MD  Palliative Care consult requested for this 82 y.o. female for goals of medical therapy in patient with pancreatic cancer with liver mets who has stopped treatment and who came to the ED with rectal bleeding.     BRIEF HISTORY: Pt was found to have pancreatic cancer about a year ago. She has had chemo as directed by Dr Finnegan, oncologist.  She quit chemo in January due to not feeling well on this.  She has not really had pain since pancreatic stent was placed last year. However, now she is jaundiced and probably has some obstructive biliary tumor burden developing.  Pain is now something requiring treatment per daughter.    She has had intermittent blood per rectum prior to admission and then had syncope on day of admission (01/11/2016).  Hemoglobin was 5.9.  Pt got 2 units of blood and fluid boluses and began to recover to baseline in terms of mentation.  Her GI physician is Dr. Elliott.  Dr Skulski recommended a stat bleeding scan.Lactic Acid was 2.6. Pt stopped chemo due to side effects of memory loss and leg swelling. During one pain episode, pt refused pain medication here (on2/13/17).   Bleeding scan showed bleeding from duodenum.  Plan was for EGD as soon as possible.  Concern is possible bleeding from duodenal ulcer (with possible erosion from pancreatic stent placed 3/16).   Pt had endoscopy this am (01/26/2016) by Dr. Skulskie and this revealed some low grade esophagitis, and diffuse inflammation and edema and linear erosions in gastric body as well as some erosions in duodenum. No active bleeding.  Pts blood pressure has been elevated this afternoon and abdominal pain is noted. Family was approached about a possible vascular surgery consult just in case the stent might be eroding into the blood vessels of the pancreas --but family declined.  Pts bleeding seems to have stopped, but she is very jaundiced appearing. Pts LFTs are elevated with AST up at 162 and ALT is 170.  Total Bilirubin is 7.0.    TODAY'S DISCUSSIONS AND DECISIONS:  I met with both daughters and they want to have pt go back to live in the home where she has been living (with RN daughter who lives in [place]). They want [facility] (they specifically requested this Hospice organization --partly b/c of family knowledge of this hospice but also b/c of the possibility of Hospice Home --IF things get really bad and they need to ask for pt to go there. They want pt to die in the home BUT if there is a lot of bleeding or other unexpected issues arise, they might want the opportunity to ask for transfer to Hospice Home if things get really unmanageable.    I have discussed pts short life expectancy --given that she is now experiencing pain and people with pancreatic cancer with pain --tend not to eat. Pt has not eaten in several days. She has no fluids going and has only taken in a few sips of water so far today.  Pt has NOT had pain --until this week. Now its 'terrible' today per daughter.  Pt would do better having less in her stomach to digest so I will be changing her meds over to non-oral and also to liquid morphine.   With daughter being a nurse, she should be able to dose the Roxanol appropriately.      All of the above indicates that pt has limited time to live.  Her vital signs are listed as stable but I find her to be a bit tachycardic with HR around 115 (listed at 71 earlier today). Pt has OK sats on room air and respirations are even and wnl at this time.  I will evaluate her for stability.   Pt does not appear to need O2 but does need a hospital bed and perhaps some other equipment.  Hospice Liaison will plan to meet with family today to set up plans for Hospice in the Home to begin soon after pt is discharged home tomorrow.    See adjusted orders, DC  med recs also to be done by me.   IMPRESSION: GI bleeding  ---blood per rectum --with syncope Acute blood loss anemia  ---transfused 2 units PRBCs Pancreatic Cancer ---with treatments stopped ---Has port-a-cath and pancreatic stent ---now with OBSTRUCTIVE BILIARY PROCESS -------total bilirubin is up to 7 with LFTS up and pt quite jaundiced in appearance Hypothyroidism Skin cancer GERD Osteoporosis DM2 GERD Hypotension from blood loss (not sepsis)   REVIEW OF SYSTEMS:  Patient is not able to provide ROS in detail.  But pt did have syncope, blood per rectum and some diffuse upper abdominal pain that was 'terrible' today.   SPIRITUAL SUPPORT SYSTEM: Yes --both daughters and others.  SOCIAL HISTORY:  reports that she has never smoked. She has never used smokeless tobacco. She reports that she drinks alcohol. She reports that she does not use illicit drugs. Pt is from [place], but she has been living with her daughter who lives in [place]. This daughter is a nurse who works in the endo dept.  There is one other daughter who lives in the [place] --she is here also today. There are other family members including a granddaughter who lives in [place] (who is present as well today).  Pt moved in with her daughter one year ago when she started chemo for pancreatic cancer.  Pt has her own mini-apartment [place] back of their home.  She has done well there until now when pt seems much more ill than previously. She even went to a funeral this past weekend with family and when they got back, her symptoms of bleeding started and the pain started.   LEGAL DOCUMENTS:  I placed a completed DNR form in the paper chart.    CODE STATUS: DNR  PAST MEDICAL HISTORY: Past Medical History  Diagnosis Date  . Hypothyroidism   . Skin cancer   . Pancreatic cancer (HCC)   . GERD (gastroesophageal reflux disease)   . Osteoporosis   . Diabetes mellitus without complication (HCC)      PAST SURGICAL HISTORY:  Past Surgical History  Procedure Laterality Date  . Cholecystectomy      ALLERGIES:  has No Known Allergies.  MEDICATIONS:  Current Facility-Administered Medications  Medication Dose Route Frequency Provider Last Rate Last Dose  . acetaminophen (TYLENOL) tablet 500 mg  500 mg Oral Q6H PRN Aruna Gouru, MD   500 mg at 01/15/2016 0906  . antiseptic oral rinse (CPC / CETYLPYRIDINIUM CHLORIDE 0.05%) solution 7 mL  7 mL Mouth Rinse BID Aruna Gouru, MD   7 mL at 01/08/2016 1000  . insulin aspart (novoLOG) injection 0-5 Units  0-5 Units Subcutaneous QHS Aruna Gouru, MD   0 Units at 01/16/2016 2200  . insulin aspart (novoLOG) injection 0-9 Units  0-9 Units Subcutaneous TID WC Aruna Gouru,   MD   0 Units at 01/18/2016 1700  . levothyroxine (SYNTHROID, LEVOTHROID) tablet 25 mcg  25 mcg Oral QODAY Aruna Gouru, MD   25 mcg at 01/15/2016 0906  . levothyroxine (SYNTHROID, LEVOTHROID) tablet 50 mcg  50 mcg Oral QODAY Aruna Gouru, MD      . ondansetron (ZOFRAN) tablet 4 mg  4 mg Oral Q6H PRN Aruna Gouru, MD       Or  . ondansetron (ZOFRAN) injection 4 mg  4 mg Intravenous Q6H PRN Aruna Gouru, MD      . oxyCODONE (Oxy IR/ROXICODONE) immediate release tablet 5 mg  5 mg Oral Q6H PRN Srikar Sudini, MD   5 mg at 01/08/2016 1002  . pantoprazole (PROTONIX) 80 mg in sodium chloride 0.9 % 100 mL IVPB  80 mg Intravenous Once Anne-Caroline Norman, MD      . pantoprazole (PROTONIX) 80 mg in sodium chloride 0.9 % 250 mL (0.32 mg/mL) infusion  8 mg/hr Intravenous Continuous Anne-Caroline Norman, MD 25 mL/hr at 01/20/2016 1114 8 mg/hr at 01/18/2016 1114  . [START ON 01/17/2016] pantoprazole (PROTONIX) injection 40 mg  40 mg Intravenous Q12H Anne-Caroline Norman, MD   40 mg at 01/09/2016 1453  . propranolol (INDERAL) tablet 40 mg  40 mg Oral BID Qing Chen, MD   40 mg at 01/05/2016 1002  . sodium chloride flush (NS) 0.9 % injection 10-40 mL  10-40 mL Intracatheter Q12H Qing Chen, MD   10 mL at 01/03/2016 1000  . sodium  chloride flush (NS) 0.9 % injection 10-40 mL  10-40 mL Intracatheter PRN Qing Chen, MD      . sodium chloride flush (NS) 0.9 % injection 3 mL  3 mL Intravenous Q12H Aruna Gouru, MD   3 mL at 01/02/2016 1000    Vital Signs: BP 123/44 mmHg  Pulse 71  Temp(Src) 98.4 F (36.9 C) (Oral)  Resp 20  Ht 5' 2" (1.575 m)  Wt 53.434 kg (117 lb 12.8 oz)  BMI 21.54 kg/m2  SpO2 99% Filed Weights   01/11/2016 1234 01/20/2016 1819 01/03/2016 0404  Weight: 50.8 kg (111 lb 15.9 oz) 54.069 kg (119 lb 3.2 oz) 53.434 kg (117 lb 12.8 oz)    Estimated body mass index is 21.54 kg/(m^2) as calculated from the following:   Height as of this encounter: 5' 2" (1.575 m).   Weight as of this encounter: 53.434 kg (117 lb 12.8 oz).  PERFORMANCE STATUS (ECOG) : 4 - Bedbound  PHYSICAL EXAM: Sleeping Very jaundiced Eyes closed Lips dry Neck w/o JVD or TM Hrt rrr but tachy at rate of 115 Lungs sound cta Abd with decreased BS Skin jaundiced but w/o mottling or cyanosis   LABS: CBC:    Component Value Date/Time   WBC 11.3* 01/02/2016 0930   WBC 4.4 03/27/2015 1330   HGB 10.5* 01/10/2016 0930   HGB 10.1* 03/27/2015 1330   HCT 31.7* 01/22/2016 0930   HCT 30.0* 03/27/2015 1330   PLT 160 01/25/2016 0930   PLT 300 03/27/2015 1330   MCV 88.6 01/11/2016 0930   MCV 97 03/27/2015 1330   NEUTROABS 13.0* 01/25/2016 1942   NEUTROABS 2.8 03/27/2015 1330   LYMPHSABS 0.5* 01/18/2016 1942   LYMPHSABS 1.1 03/27/2015 1330   MONOABS 0.6 01/22/2016 1942   MONOABS 0.3 03/27/2015 1330   EOSABS 0.0 01/03/2016 1942   EOSABS 0.0 03/27/2015 1330   BASOSABS 0.0 01/08/2016 1942   BASOSABS 0.1 03/27/2015 1330   Comprehensive Metabolic Panel:    Component Value Date/Time     NA 138 01/10/2016 0930   NA 128* 03/27/2015 1330   K 3.9 01/13/2016 0930   K 3.7 03/27/2015 1330   CL 107 01/01/2016 0930   CL 97* 03/27/2015 1330   CO2 23 01/06/2016 0930   CO2 28 03/27/2015 1330   BUN 35* 01/20/2016 0930   BUN 19 03/27/2015 1330    CREATININE 0.52 01/07/2016 0930   CREATININE 0.67 03/27/2015 1330   GLUCOSE 163* 01/11/2016 0930   GLUCOSE 222* 03/27/2015 1330   CALCIUM 7.6* 01/23/2016 0930   CALCIUM 7.8* 03/27/2015 1330   AST 162* 01/04/2016 0930   AST 25 03/27/2015 1330   ALT 170* 01/21/2016 0930   ALT 17 03/27/2015 1330   ALKPHOS 235* 01/28/2016 0930   ALKPHOS 60 03/27/2015 1330   BILITOT 7.0* 01/13/2016 0930   BILITOT 0.7 03/27/2015 1330   PROT 5.1* 01/13/2016 0930   PROT 5.9* 03/27/2015 1330   ALBUMIN 2.4* 01/28/2016 0930   ALBUMIN 3.0* 03/27/2015 1330    More than 50% of the visit was spent in counseling/coordination of care: Yes  Time Spent: 80 minutes

## 2016-01-14 NOTE — Anesthesia Procedure Notes (Signed)
Performed by: COOK-MARTIN, Roshini Fulwider Pre-anesthesia Checklist: Patient identified, Emergency Drugs available, Suction available, Patient being monitored and Timeout performed Patient Re-evaluated:Patient Re-evaluated prior to inductionOxygen Delivery Method: Nasal cannula Preoxygenation: Pre-oxygenation with 100% oxygen Intubation Type: IV induction Airway Equipment and Method: Bite block Placement Confirmation: CO2 detector and positive ETCO2     

## 2016-01-14 NOTE — Care Management (Addendum)
Patient with pancreatic cancer  for discharge home with hospice.  Agency preference is  [facility].  Craige Cotta liaison is aware.  Anticipate discahrge 2/15.

## 2016-01-14 NOTE — Progress Notes (Signed)
Spoke with Dr. Bridgett Larsson about patient's elevated BP. Gave verbal order to reorder patient's home medication propranolol. Order placed. Patient back from EGD and complaining of abdominal pain, pain medication given. Will reassess. Wilnette Kales

## 2016-01-14 NOTE — Progress Notes (Signed)
Pharmacy Antibiotic Note  Amy Mckenzie is a 82 y.o. female admitted on 01/25/2016 with bacteremia.  Pharmacy has been consulted for Zosyn dosing.  Plan: Zosyn 3.375 gm IV Q8H EI. Pharmacy will continue to follow.   Height: 5\' 2"  (157.5 cm) Weight: 119 lb 3.2 oz (54.069 kg) (admission) IBW/kg (Calculated) : 50.1  Temp (24hrs), Avg:97.9 F (36.6 C), Min:97.8 F (36.6 C), Max:98.1 F (36.7 C)   Recent Labs Lab 01/12/2016 1350 01/11/2016 1942  WBC 11.6* 14.1*  CREATININE 0.61  --   LATICACIDVEN 2.6* 2.0    Estimated Creatinine Clearance: 35.5 mL/min (by C-G formula based on Cr of 0.61).    No Known Allergies   Thank you for allowing pharmacy to be a part of this patient's care.  Laural Benes, Pharm.D., BCPS Clinical Pharmacist 01/27/2016 3:57 AM

## 2016-01-14 NOTE — Anesthesia Postprocedure Evaluation (Signed)
Anesthesia Post Note  Patient: Amy Mckenzie  Procedure(s) Performed: Procedure(s) (LRB): ESOPHAGOGASTRODUODENOSCOPY (EGD) (N/A)  Patient location during evaluation: PACU Anesthesia Type: General Level of consciousness: awake and alert Pain management: pain level controlled Vital Signs Assessment: post-procedure vital signs reviewed and stable Respiratory status: spontaneous breathing, nonlabored ventilation, respiratory function stable and patient connected to nasal cannula oxygen Cardiovascular status: blood pressure returned to baseline and stable Postop Assessment: no signs of nausea or vomiting Anesthetic complications: no    Last Vitals:  Filed Vitals:   01/18/2016 0531 01/05/2016 0755  BP: 125/44 133/54  Pulse: 66 69  Temp: 36.5 C 35.7 C  Resp: 18 16    Last Pain:  Filed Vitals:   01/21/2016 0758  PainSc: 7                  Molli Barrows

## 2016-01-14 NOTE — Transfer of Care (Signed)
Immediate Anesthesia Transfer of Care Note  Patient: Amy Mckenzie  Procedure(s) Performed: Procedure(s): ESOPHAGOGASTRODUODENOSCOPY (EGD) (N/A)  Patient Location: PACU  Anesthesia Type:General  Level of Consciousness: awake and sedated  Airway & Oxygen Therapy: Patient Spontanous Breathing and Patient connected to nasal cannula oxygen  Post-op Assessment: Report given to RN and Post -op Vital signs reviewed and stable  Post vital signs: Reviewed and stable  Last Vitals:  Filed Vitals:   01/09/2016 0531 01/08/2016 0755  BP: 125/44 133/54  Pulse: 66 69  Temp: 36.5 C 35.7 C  Resp: 18 16    Complications: No apparent anesthesia complications

## 2016-01-14 NOTE — Progress Notes (Signed)
Report given to [facility] on 1C. Patient will be transported. Family aware. Wilnette Kales

## 2016-01-15 ENCOUNTER — Encounter: Payer: Self-pay | Admitting: Gastroenterology

## 2016-01-15 DIAGNOSIS — G893 Neoplasm related pain (acute) (chronic): Secondary | ICD-10-CM

## 2016-01-15 DIAGNOSIS — I959 Hypotension, unspecified: Secondary | ICD-10-CM | POA: Insufficient documentation

## 2016-01-15 LAB — TYPE AND SCREEN
ABO/RH(D): A POS
Antibody Screen: NEGATIVE
UNIT DIVISION: 0
UNIT DIVISION: 0
UNIT DIVISION: 0
Unit division: 0
Unit division: 0

## 2016-01-15 LAB — PREPARE RBC (CROSSMATCH)

## 2016-01-15 MED ORDER — SODIUM CHLORIDE 0.9 % IV SOLN
INTRAVENOUS | Status: DC
  Administered 2016-01-15: 10:00:00 via INTRAVENOUS
  Filled 2016-01-15: qty 90

## 2016-01-15 MED ORDER — SODIUM CHLORIDE 0.9 % IV SOLN: 3.0000 mg/h | INTRAVENOUS | Status: DC

## 2016-01-15 MED ORDER — SODIUM CHLORIDE 0.9 % IV SOLN
INTRAVENOUS | Status: DC
  Filled 2016-01-15: qty 90

## 2016-01-15 MED ORDER — MORPHINE 100MG IN NS 100ML (1MG/ML) PREMIX INFUSION: 3.0000 mg/h | INTRAVENOUS | Status: DC

## 2016-01-15 MED ORDER — MORPHINE SULFATE (PF) 4 MG/ML IV SOLN
4.0000 mg | INTRAVENOUS | Status: DC | PRN
  Administered 2016-01-15 (×2): 4 mg via INTRAVENOUS
  Filled 2016-01-15 (×2): qty 1

## 2016-01-15 NOTE — Progress Notes (Signed)
Patient is comfort care, Morphine PO given for pain with minimum improvement, MD notified of patient severe pain that's not improvement with PRN PO morphine, Morphine 4mg  IV given with improvement. Continues on protonix gtt, family at bedside, possible d/c to home with Hospice.

## 2016-01-15 NOTE — Care Management Important Message (Signed)
Important Message  Patient Details  Name: Amy Mckenzie MRN: [id number] Date of Birth: 1921/06/26   Medicare Important Message Given:  Yes    [name] 01/15/2016, 9:11 AM

## 2016-01-15 NOTE — Consult Note (Signed)
Chart reviewed. Events noted, agree with current management.  Will sign off. Lollie Sails, MD Gastroenterology

## 2016-01-15 NOTE — Progress Notes (Signed)
Palliative Medicine Inpatient Consult Follow Up Note   Name: Amy Mckenzie Date: 01/15/2016 MRN: [id number]  DOB: Oct 28, 1921  Referring Physician: Demetrios Loll, MD  Palliative Care consult requested for this 82 y.o. female for goals of medical therapy in patient with pancreatic cancer with liver mets who has stopped treatment and who came to the ED with rectal bleeding.   TODAY'S PLAN: Pt is having sudden very severe pancreatic cancer related pain. This reflects an obstructive biliary process from tumor growth. She had not been in pain all year long --and this severe pain has taken family by surprise. The pt's BP is low (systolic 80 this am). Her respirations are altered. Family now feeling that pt is near death and after talking with daughter by phone, we are all in agreement with starting a morphine drip and planning on keeping pt here as she is not stable for discharge home or elsewhere. Morphine drip will be titrated as needed. Pt is expected to pass away here.    IMPRESSION: GI bleeding  ---blood per rectum --with syncope Acute blood loss anemia  ---transfused 2 units PRBCs Pancreatic Cancer ---with treatments stopped ---Has port-a-cath and pancreatic stent ---now with OBSTRUCTIVE BILIARY PROCESS -------total bilirubin is up to 7 with LFTS up and pt quite jaundiced  appearance Severe Abd Pain Hypothyroidism Skin cancer GERD Osteoporosis DM2 GERD Hypotension from blood loss (not sepsis)    REVIEW OF SYSTEMS:  Patient is not able to provide ROS due to approaching death  CODE STATUS: DNR   PAST MEDICAL HISTORY: Past Medical History  Diagnosis Date  . Hypothyroidism   . Skin cancer   . Pancreatic cancer ([place])   . GERD (gastroesophageal reflux disease)   . Osteoporosis   . Diabetes mellitus without complication ([place])     PAST SURGICAL HISTORY:  Past Surgical History  Procedure Laterality Date  . Cholecystectomy    . Esophagogastroduodenoscopy N/A 01/01/2016     Procedure: ESOPHAGOGASTRODUODENOSCOPY (EGD);  Surgeon: Lollie Sails, MD;  Location: [facility] ENDOSCOPY;  Service: Endoscopy;  Laterality: N/A;    Vital Signs: BP 82/37 mmHg  Pulse 95  Temp(Src) 99.6 F (37.6 C) (Oral)  Resp 18  Ht 5\' 2"  (1.575 m)  Wt 52.708 kg (116 lb 3.2 oz)  BMI 21.25 kg/m2  SpO2 92% Filed Weights   01/03/2016 1819 01/01/2016 0404 01/03/2016 1920  Weight: 54.069 kg (119 lb 3.2 oz) 53.434 kg (117 lb 12.8 oz) 52.708 kg (116 lb 3.2 oz)    Estimated body mass index is 21.25 kg/(m^2) as calculated from the following:   Height as of this encounter: 5\' 2"  (1.575 m).   Weight as of this encounter: 52.708 kg (116 lb 3.2 oz).  PHYSICAL EXAM: Sedated Peaceful appearing in 1-C bed Two daughters are bedside NAD now with Morphine drip going at 3 mg/ hr Eyes closed Jaundiced  Hrt rrr no m Lungs cta no ronchi  --resp are slow at 10 Abd soft and yet distended Ext still with pulses and no mottling  More than 50% of the visit was spent in counseling/coordination of care: YES  Time Spent:  65 min

## 2016-01-15 NOTE — Consult Note (Signed)
Patient seen and evaluated with family at bedside. Patient has recently been placed on comfort care and is declining. Patient is not arousable. Agree with comfort care. No other interventions are needed. Appreciate palliative care input.  Approximately 30 minutes was spent with family at bedside.

## 2016-01-15 NOTE — Progress Notes (Addendum)
[place] at [place] NAME: Amy Mckenzie    MR#:  [id number]  DATE OF BIRTH:  31-Dec-1920  SUBJECTIVE:  CHIEF COMPLAINT:   Chief Complaint  Patient presents with  . Loss of Consciousness  . Rectal Bleeding   Patient is unresponsive, on comfort care.  REVIEW OF SYSTEMS:  Unable to get.   DRUG ALLERGIES:  No Known Allergies  VITALS:  Blood pressure 82/37, pulse 95, temperature 99.6 F (37.6 C), temperature source Oral, resp. rate 18, height 5\' 2"  (1.575 m), weight 52.708 kg (116 lb 3.2 oz), SpO2 92 %.  PHYSICAL EXAMINATION:  GENERAL:  82 y.o.-year-old patient lying in the bed with no acute distress.  HEENT: Head atraumatic, normocephalic.   LUNGS: Normal breath sounds bilaterally, no wheezing, rales,rhonchi or crepitation. No use of accessory muscles of respiration.  CARDIOVASCULAR: S1, S2 normal. No murmurs, rubs, or gallops.  ABDOMEN: Soft, nondistended. Bowel sounds present. No organomegaly or mass.  EXTREMITIES: No pedal edema, cyanosis, or clubbing.  NEUROLOGIC: unable to exam PSYCHIATRIC: The patient is unresponsive. SKIN: No obvious rash, lesion, or ulcer.    LABORATORY PANEL:   CBC  Recent Labs Lab 01/07/2016 0930  WBC 11.3*  HGB 10.5*  HCT 31.7*  PLT 160   ------------------------------------------------------------------------------------------------------------------  Chemistries   Recent Labs Lab 01/08/2016 0930  NA 138  K 3.9  CL 107  CO2 23  GLUCOSE 163*  BUN 35*  CREATININE 0.52  CALCIUM 7.6*  AST 162*  ALT 170*  ALKPHOS 235*  BILITOT 7.0*   ------------------------------------------------------------------------------------------------------------------  Cardiac Enzymes  Recent Labs Lab 01/17/2016 1350  TROPONINI 0.03   ------------------------------------------------------------------------------------------------------------------  RADIOLOGY:  Dg Chest 1 View  01/02/2016   CLINICAL DATA:  Sepsis, pancreatic cancer, syncope EXAM: CHEST 1 VIEW COMPARISON:  12/11/2015 FINDINGS: Borderline cardiomegaly. No infiltrate or pleural effusion. No pulmonary edema. Mild elevation of the right hemidiaphragm. IMPRESSION: No active disease. Electronically Signed   By: Lahoma Crocker M.D.   On: 01/08/2016 15:24   Ct Head Wo Contrast  01/03/2016  CLINICAL DATA:  Syncope, bloody stool this morning stool this morning EXAM: CT HEAD WITHOUT CONTRAST TECHNIQUE: Contiguous axial images were obtained from the base of the skull through the vertex without intravenous contrast. COMPARISON:  None. FINDINGS: No skull fracture is noted. Atherosclerotic calcifications of carotid siphon. Mild cerebral atrophy. No intracranial hemorrhage, mass effect or midline shift. Paranasal sinuses and mastoid air cells are unremarkable. Mild periventricular white matter decreased attenuation probable due to chronic small vessel ischemic changes no acute cortical infarction. No mass lesion is noted on this unenhanced scan. No intraventricular hemorrhage. IMPRESSION: No acute intracranial abnormality. Mild cerebral atrophy. Mild periventricular white matter decreased attenuation probable due to chronic small vessel ischemic changes. Atherosclerotic calcifications of carotid siphon. Electronically Signed   By: Lahoma Crocker M.D.   On: 01/12/2016 13:02   Nm Gi Blood Loss  01/06/2016  CLINICAL DATA:  82 year old female with history of pancreatic cancer presents with GI bleed and symptomatic anemia with syncope earlier today. EXAM: NUCLEAR MEDICINE GASTROINTESTINAL BLEEDING SCAN TECHNIQUE: Sequential abdominal images were obtained following intravenous administration of Tc-9m labeled red blood cells. RADIOPHARMACEUTICALS:  22.0 mCi Tc-40m in-vitro labeled red cells. COMPARISON:  12/11/2015 PET-CT. FINDINGS: There is active GI bleed beginning at minute 38 in the central abdomen, which is located just above the level of the aortic  bifurcation and extends from the medial central right abdomen horizontally into the medial central left abdomen, favor distal (  3rd/4th portion) duodenal hemorrhage. No additional sites of active GI bleed. IMPRESSION: Active GI bleed in the central abdomen, favor distal duodenal hemorrhage. These results were called by telephone at the time of interpretation on 01/11/2016 at 6:01 pm to Dr. Nicholes Mango , who verbally acknowledged these results. Electronically Signed   By: Ilona Sorrel M.D.   On: 01/02/2016 18:26    EKG:   Orders placed or performed during the hospital encounter of 01/14/2016  . ED EKG  . ED EKG  . EKG 12-Lead  . EKG 12-Lead    ASSESSMENT AND PLAN:   1. Syncope secondary to symptomatic anemia from GI bleed with hemoglobin 5.9  Status post 3 units of blood transfusion and fluid boluses. Hb up to 10.5 EGD by Dr. Donnella Sham:  LA Grade B erosive esophagitis.  - Erosive gastritis.  - Medium-sized hiatal hernia.  - Duodenal erosions without bleeding.  - Duodenitis. C  Bleeding scan: Active GI bleed in the central abdomen, favor distal duodenal hemorrhage. discontinued Protonix drip.  2. Pancreatic cancer with liver metastases Patient has discontinued her chemotherapy recently in January Status post pancreatic stent  3. Sepsis with UTI and bacteremia. Blood cultures: GNR, treated with zosyn, discontinued.  4. Chronic history of hypothyroidism. Was on Synthroid 5. Chronic history of diabetes mellitus not on any medications Was on sliding scale as well.   The patient has very poor prognosis and is in comfort care.    All the records are reviewed and case discussed with Care Management/Social Workerr. Management plans discussed with the patient, her 2 daughters (POA) and they are in agreement.  CODE STATUS: DNR  TOTAL TIME TAKING CARE OF THIS PATIENT: 32 minutes.  Greater than 50% time was spent on  coordination of care and face-to-face counseling.  POSSIBLE D/C IN 2 DAYS, DEPENDING ON CLINICAL CONDITION.   Demetrios Loll M.D on 01/15/2016 at 12:11 PM  Between 7am to 6pm - Pager - [phone]  After 6pm go to [email or web address] - password [email or web address] [facility]  Office  [phone]  CC: Primary care physician; Lloyd Huger, MD

## 2016-01-15 NOTE — Progress Notes (Signed)
°   01/15/16 1100  Clinical Encounter Type  Visited With Patient and family together  Visit Type Critical Care  Referral From Chaplain  Consult/Referral To Chaplain  Spiritual Encounters  Spiritual Needs Prayer  Stress Factors  Patient Stress Factors Not reviewed  Family Stress Factors Health changes  Chaplain Mitzi Hansen introduced me to daughters of patient sitting beside.  Spiritual care visit, offered support and presence. [place]

## 2016-01-15 NOTE — Progress Notes (Signed)
Follow up visit made to new referral for hospice services at home following discharge. Patient has had a significant decline overnight, now on a morphine drip for pain management, unresponsive. Daughters Lelon Frohlich and Marion at bedside. Emotional support offered. Chaplain requested for prayer per their request. Referral and triage notified of change in discharge plan. Flo Shanks Rn, BSn, [facility] [place], [facility] [phone] c

## 2016-01-15 NOTE — Progress Notes (Addendum)
Palliative Care Update  Pt is having sudden very severe pancreatic cancer related pain. This reflects an obstructive biliary process from tumor growth.  She had not been in pain all year long --and this severe pain has taken family by surprise.  The pt's BP is low (systolic 80 this am). Her respirations are altered.  Family now feeling that pt is near death and after talking with daughter by phone, we are all in agreement with starting a morphine drip and planning on keeping pt here as she is not stable for discharge home or elsewhere.  Morphine drip will be titrated as needed.  Pt is expected to pass away here.   Kirby Funk, MD

## 2016-01-16 DIAGNOSIS — D62 Acute posthemorrhagic anemia: Secondary | ICD-10-CM

## 2016-01-16 DIAGNOSIS — Z85828 Personal history of other malignant neoplasm of skin: Secondary | ICD-10-CM

## 2016-01-16 DIAGNOSIS — E039 Hypothyroidism, unspecified: Secondary | ICD-10-CM

## 2016-01-16 DIAGNOSIS — R7881 Bacteremia: Secondary | ICD-10-CM

## 2016-01-16 DIAGNOSIS — I9589 Other hypotension: Secondary | ICD-10-CM

## 2016-01-17 LAB — CULTURE, BLOOD (ROUTINE X 2)

## 2016-01-22 LAB — BLOOD GAS, VENOUS
Acid-Base Excess: 1.2 mmol/L (ref 0.0–3.0)
BICARBONATE: 26 meq/L (ref 21.0–28.0)
FIO2: 0.21
Patient temperature: 37
pCO2, Ven: 41 mmHg — ABNORMAL LOW (ref 44.0–60.0)
pH, Ven: 7.41 (ref 7.320–7.430)

## 2016-01-29 NOTE — Discharge Summary (Signed)
[place] at [place] NAME: Amy Mckenzie    MR#:  [id number]  DATE OF BIRTH:  September 03, 1921  DATE OF ADMISSION:  01/05/2016 ADMITTING PHYSICIAN: Nicholes Mango, MD  DATE OF DEATH: 01/29/16 10:15 PRIMARY CARE PHYSICIAN: Lloyd Huger, MD    ADMISSION DIAGNOSIS:  Acute posthemorrhagic anemia [D62] Sepsis ([place]) [A41.9] Symptomatic anemia [D64.9] Hypotension, unspecified hypotension type [I95.9] Syncope, unspecified syncope type [R55] Gastrointestinal hemorrhage, unspecified gastritis, unspecified gastrointestinal hemorrhage type [K92.2]   DISCHARGE DIAGNOSIS:  Syncope secondary to symptomatic anemia from GI bleed  Pancreatic cancer with liver metastases Sepsis with UTI and bacteremia SECONDARY DIAGNOSIS:   Past Medical History  Diagnosis Date  . Hypothyroidism   . Skin cancer   . Pancreatic cancer ([place])   . GERD (gastroesophageal reflux disease)   . Osteoporosis   . Diabetes mellitus without complication ([place])     HOSPITAL COURSE:   1. Syncope secondary to symptomatic anemia from GI bleed with hemoglobin 5.9  Status post 3 units of blood transfusion and fluid boluses. Hb up to 10.5 EGD by Dr. Donnella Sham:  LA Grade B erosive esophagitis.  - Erosive gastritis.  - Medium-sized hiatal hernia.   - Duodenal erosions without bleeding.  - Duodenitis. C  Bleeding scan: Active GI bleed in the central abdomen, favor distal duodenal hemorrhage. discontinued Protonix drip.  2. Pancreatic cancer with liver metastases Patient has discontinued her chemotherapy recently in January Status post pancreatic stent  3. Sepsis with UTI and bacteremia. Blood cultures: GNR, treated with zosyn, discontinued.  4. Chronic history of hypothyroidism. Was on Synthroid 5. Chronic history of diabetes mellitus not on any medications Was on sliding scale as well.   Since  the patient has very poor prognosis, she is put in comfort care. She expired at 10:15 am today.  DISCHARGE CONDITIONS:   EXPIRED.  CONSULTS OBTAINED:     DRUG ALLERGIES:  No Known Allergies  CODE STATUS:     Code Status Orders        Start     Ordered   01/12/2016 1823  Do not attempt resuscitation (DNR)   Continuous    Question Answer Comment  In the event of cardiac or respiratory ARREST Do not call a "code blue"   In the event of cardiac or respiratory ARREST Do not perform Intubation, CPR, defibrillation or ACLS   In the event of cardiac or respiratory ARREST Use medication by any route, position, wound care, and other measures to relive pain and suffering. May use oxygen, suction and manual treatment of airway obstruction as needed for comfort.   Comments RN may pronounce      01/14/2016 1822    Code Status History    Date Active Date Inactive Code Status Order ID Comments User Context   This patient has a current code status but no historical code status.    Advance Directive Documentation        Most Recent Value   Type of Advance Directive  Healthcare Power of Attorney, Living will   Pre-existing out of facility DNR order (yellow form or pink MOST form)     "MOST" Form in Place?        TOTAL TIME TAKING CARE OF THIS PATIENT: 20 minutes.    Demetrios Loll M.D on 01-29-2016 at 2:18 PM  Between 7am to 6pm - Pager - [phone]  After 6pm go to [email or web address] - password [facility]  Office  [phone]  CC: Primary care physician; Lloyd Huger, MD

## 2016-01-29 NOTE — Progress Notes (Signed)
Palliative Medicine Inpatient Consult Follow Up Note   Name: Amy Mckenzie Date: 17-Jan-2016 MRN: [id number]  DOB: 18-Mar-1921  Referring Physician: Demetrios Loll, MD  Palliative Care consult requested for this 82 y.o. female for goals of medical therapy in patient with pancreatic cancer and jaundice.   PLAN: Pt continues on morphine drip at 3 mg/ hr.  Family did not feel she needed an increase in dose as she has been reasonably comfortable on this dose.  Prior pain management was ineffective.  See previous notes. Will continue.  Death is expected anytime.    IMPRESSION: GI bleeding  ---blood per rectum --with syncope Acute blood loss anemia  ---transfused 2 units PRBCs Pancreatic Cancer ---with treatments stopped ---Has port-a-cath and pancreatic stent ---now with OBSTRUCTIVE BILIARY PROCESS -------total bilirubin is up to 7 with LFTS up and pt quite jaundiced appearance Severe Abd Pain Hypothyroidism Skin cancer GERD Osteoporosis DM2 GERD Hypotension from blood loss (not sepsis) Bacteremia  REVIEW OF SYSTEMS:  Patient is not able to provide ROS due to actively dying.  CODE STATUS: DNR   PAST MEDICAL HISTORY: Past Medical History  Diagnosis Date  . Hypothyroidism   . Skin cancer   . Pancreatic cancer ([place])   . GERD (gastroesophageal reflux disease)   . Osteoporosis   . Diabetes mellitus without complication ([place])     PAST SURGICAL HISTORY:  Past Surgical History  Procedure Laterality Date  . Cholecystectomy    . Esophagogastroduodenoscopy N/A 01/22/2016    Procedure: ESOPHAGOGASTRODUODENOSCOPY (EGD);  Surgeon: Lollie Sails, MD;  Location[facility] END[facility]COPY;  Service: Endoscopy;  Laterality: N/A;    Vital Signs: BP 82/37 mmHg  Pulse 95  Temp(Src) 99.6 F (37.6 C) (Oral)  Resp 18  Ht 5\' 2"  (1.575 m)  Wt 52.708 kg (116 lb 3.2 oz)  BMI 21.25 kg/m2  SpO2 92% Filed Weights   01/01/2016 1819 01/24/2016 0404 01/18/2016 1920  Weight: 54.069 kg (119 lb 3.2 oz)  53.434 kg (117 lb 12.8 oz) 52.708 kg (116 lb 3.2 oz)    Estimated body mass index is 21.25 kg/(m^2) as calculated from the following:   Height as of this encounter: 5\' 2"  (1.575 m).   Weight as of this encounter: 52.708 kg (116 lb 3.2 oz).  PHYSICAL EXAM: NAD Eyes closed NoJVD or TM Hrt rrr --distant Lungs cta --slow respirations noted ABd distended but soft Skin w/o mottling or cyanosis as yet    Time Spent: 15 min

## 2016-01-29 NOTE — Plan of Care (Signed)
Post Mortem care provided. Portacath deaccessed. Transported to [employer] by orderly.

## 2016-01-29 NOTE — Progress Notes (Signed)
Condolence visit made. Patient died at 10:15 am per staff RN Daleen Snook. Emotional support offered. Referral notified. Thank you. Flo Shanks RN, BSN, [place] [facility] [place], [facility] [phone] c

## 2016-01-29 NOTE — Progress Notes (Signed)
Called to room by family member. Patient with no pulse, no respirations, lack of pupillary response. Patient is DNR and Comfort Care. Order for [employer]. Pronounced by Dr. Bridgett Larsson. Family at bedside. Nursing Supervisor notified. COP[facility] notified by Trinda Pascal, RN.

## 2016-01-29 DEATH — deceased

## 2016-02-05 DIAGNOSIS — D62 Acute posthemorrhagic anemia: Secondary | ICD-10-CM | POA: Insufficient documentation

## 2016-05-20 ENCOUNTER — Other Ambulatory Visit: Payer: Self-pay | Admitting: Nurse Practitioner
